# Patient Record
Sex: Female | Born: 1975 | Hispanic: Yes | Marital: Single | Smoking: Never smoker
Health system: Southern US, Community
[De-identification: ages and names within clinical notes are randomized; demographics above are authoritative.]

## PROBLEM LIST (undated history)

## (undated) ENCOUNTER — Emergency Department (HOSPITAL_COMMUNITY): Admission: EM | Payer: Self-pay | Source: Home / Self Care

## (undated) DIAGNOSIS — C55 Malignant neoplasm of uterus, part unspecified: Secondary | ICD-10-CM

## (undated) HISTORY — PX: ABDOMINAL HYSTERECTOMY: SHX81

## (undated) HISTORY — PX: APPENDECTOMY: SHX54

---

## 2017-01-27 ENCOUNTER — Emergency Department (HOSPITAL_COMMUNITY)
Admission: EM | Admit: 2017-01-27 | Discharge: 2017-01-27 | Disposition: A | Discharge status: Home / Self Care | Payer: Self-pay | Attending: Emergency Medicine | Admitting: Emergency Medicine

## 2017-01-27 ENCOUNTER — Encounter (HOSPITAL_COMMUNITY): Payer: Self-pay

## 2017-01-27 DIAGNOSIS — R21 Rash and other nonspecific skin eruption: Secondary | ICD-10-CM | POA: Insufficient documentation

## 2017-01-27 MED ORDER — TRIAMCINOLONE ACETONIDE 0.1 % EX CREA: 1.0000 "application " | TOPICAL_CREAM | Freq: Two times a day (BID) | CUTANEOUS | 0 refills | Status: DC

## 2017-01-27 NOTE — Discharge Instructions (Signed)
Apply the triamcinolone cream twice a day to the affected areas for itching and rash. Follow-up with the dermatologist as soon as possible. The dermatologist listed is merely a suggestion. You are free to choose any dermatologist.

## 2017-01-27 NOTE — ED Triage Notes (Signed)
Patient complains of 1 month of intermittent itchy rash to face and neck. Has taken benadryl with some relief. Alert and oriented, no distress. Denies any new products or meds

## 2017-01-27 NOTE — ED Notes (Signed)
Pt is in stable condition upon d/c and ambulates from ED. 

## 2017-01-27 NOTE — ED Provider Notes (Signed)
[facility] DEPT Provider Note   CSN: [id number] Arrival date & time: 01/27/17  1422   By signing my name below, I, Charolotte Eke, attest that this documentation has been prepared under the direction and in the presence of Edker Punt, PA-C. Electronically Signed: Charolotte Eke, Scribe. 01/27/17. 2:55 PM.    History   Chief Complaint Chief Complaint  Patient presents with  . Rash    HPI Lisa Boyd is a 41 y.o. female who presents to the Emergency Department complaining of intermittent erythematous, pruritic rash on face and chest that appeared 1 month ago. Pt denies hormone therapy, birth control. Pt states that the rash and a tingling sensation that "feels like ants on her skin" only lasts for a few hours and then disappears. It does not seem to be present with stress, different times of day, or sun exposure. She has taken benadryl with limited relief. She has been using OTC itch creams without relief. She has not sought treatment for this prior to arrival here today. She denies new products or medications. Also denies fever, feelings of illness, open wounds, or new complaints.  HPI  History reviewed. No pertinent past medical history.  There are no active problems to display for this patient.   History reviewed. No pertinent surgical history.  OB History    No data available       Home Medications    Prior to Admission medications   Medication Sig Start Date End Date Taking? Authorizing Provider  triamcinolone cream (KENALOG) 0.1 % Apply 1 application topically 2 (two) times daily. 01/27/17   Lorayne Bender, PA-C    Family History No family history on file.  Social History Social History  Substance Use Topics  . Smoking status: Never Smoker  . Smokeless tobacco: Never Used  . Alcohol use Not on file     Allergies   Penicillins   Review of Systems Review of Systems  Constitutional: Negative for fever.  HENT: Negative for facial swelling.     Respiratory: Negative for shortness of breath.   Skin: Positive for rash.     Physical Exam Updated Vital Signs There were no vitals taken for this visit.  Physical Exam  Constitutional: She appears well-developed and well-nourished. No distress.  HENT:  Head: Normocephalic and atraumatic.  No oral lesions.  Eyes: Conjunctivae are normal.  Neck: Neck supple.  Cardiovascular: Normal rate and regular rhythm.   Pulmonary/Chest: Effort normal.  Neurological: She is alert.  Skin: Skin is warm and dry. She is not diaphoretic.  Scattered macular erythematous rash to the face that extends to the superior chest. Rash spares her palms.  Psychiatric: She has a normal mood and affect. Her behavior is normal.  Nursing note and vitals reviewed.    ED Treatments / Results   DIAGNOSTIC STUDIES:  COORDINATION OF CARE: 2:55 PM Discussed treatment plan with pt at bedside and pt agreed to plan.    Labs (all labs ordered are listed, but only abnormal results are displayed) Labs Reviewed - No data to display  EKG  EKG Interpretation None       Radiology No results found.  Procedures Procedures (including critical care time)  Medications Ordered in ED Medications - No data to display   Initial Impression / Assessment and Plan / ED Course  I have reviewed the triage vital signs and the nursing notes.  Pertinent labs & imaging results that were available during my care of the patient were reviewed by me  and considered in my medical decision making (see chart for details).     Patient presents with an intermittent pruritic rash.  No red flag symptoms. PCP vs dermatology follow up. The patient was given instructions for home care as well as return precautions. Patient voices understanding of these instructions, accepts the plan, and is comfortable with discharge.    Final Clinical Impressions(s) / ED Diagnoses   Final diagnoses:  Rash    New Prescriptions Discharge  Medication List as of 01/27/2017  2:58 PM    START taking these medications   Details  triamcinolone cream (KENALOG) 0.1 % Apply 1 application topically 2 (two) times daily., Starting Thu 01/27/2017, Print       I personally performed the services described in this documentation, which was scribed in my presence. The recorded information has been reviewed and is accurate.   Lorayne Bender, PA-C 01/27/17 1732    Leo Grosser, MD 01/27/17 Lurline Hare

## 2017-10-21 ENCOUNTER — Other Ambulatory Visit: Payer: Self-pay

## 2017-10-21 ENCOUNTER — Encounter (HOSPITAL_COMMUNITY): Payer: Self-pay

## 2017-10-21 DIAGNOSIS — N390 Urinary tract infection, site not specified: Secondary | ICD-10-CM | POA: Insufficient documentation

## 2017-10-21 DIAGNOSIS — Z5321 Procedure and treatment not carried out due to patient leaving prior to being seen by health care provider: Secondary | ICD-10-CM | POA: Insufficient documentation

## 2017-10-21 LAB — POC URINE PREG, ED: PREG TEST UR: NEGATIVE

## 2017-10-21 LAB — URINALYSIS, ROUTINE W REFLEX MICROSCOPIC
Bilirubin Urine: NEGATIVE
Glucose, UA: NEGATIVE mg/dL
Ketones, ur: NEGATIVE mg/dL
Nitrite: NEGATIVE
Protein, ur: NEGATIVE mg/dL
SPECIFIC GRAVITY, URINE: 1.025 (ref 1.005–1.030)
pH: 5 (ref 5.0–8.0)

## 2017-10-21 NOTE — ED Triage Notes (Signed)
Pt states she has c/o burning when she urinate that started today; pt denies discharge; pt also c/o facial itching allover; pt has seen MD and was told to take benedrayl; pt has some facial peeling at triage; pt a&ox 4-Monique,RN

## 2017-10-22 ENCOUNTER — Emergency Department (HOSPITAL_COMMUNITY)
Admission: EM | Admit: 2017-10-22 | Discharge: 2017-10-22 | Discharge status: Against Medical Advice | Payer: Self-pay | Attending: Emergency Medicine | Admitting: Emergency Medicine

## 2017-10-22 HISTORY — DX: Malignant neoplasm of uterus, part unspecified: C55

## 2017-10-22 NOTE — ED Notes (Signed)
Called Pt for vitals no answer. 

## 2017-10-22 NOTE — ED Notes (Signed)
Pt called x2 by nurse to come back. No answer from pt. [facility] did not see pt in waiting room.

## 2017-10-22 NOTE — ED Notes (Signed)
Pt called for room no answer

## 2018-12-26 ENCOUNTER — Encounter (HOSPITAL_COMMUNITY): Payer: Self-pay | Admitting: *Deleted

## 2018-12-26 ENCOUNTER — Emergency Department (HOSPITAL_COMMUNITY)
Admission: EM | Admit: 2018-12-26 | Discharge: 2018-12-26 | Disposition: A | Discharge status: Home / Self Care | Payer: Self-pay | Attending: Emergency Medicine | Admitting: Emergency Medicine

## 2018-12-26 ENCOUNTER — Other Ambulatory Visit: Payer: Self-pay

## 2018-12-26 DIAGNOSIS — M79602 Pain in left arm: Secondary | ICD-10-CM | POA: Insufficient documentation

## 2018-12-26 DIAGNOSIS — M791 Myalgia, unspecified site: Secondary | ICD-10-CM

## 2018-12-26 DIAGNOSIS — M62838 Other muscle spasm: Secondary | ICD-10-CM | POA: Insufficient documentation

## 2018-12-26 LAB — CBC WITH DIFFERENTIAL/PLATELET
Abs Immature Granulocytes: 0.01 10*3/uL (ref 0.00–0.07)
Basophils Absolute: 0 10*3/uL (ref 0.0–0.1)
Basophils Relative: 0 %
EOS ABS: 0.2 10*3/uL (ref 0.0–0.5)
EOS PCT: 3 %
HCT: 43.8 % (ref 36.0–46.0)
Hemoglobin: 14.6 g/dL (ref 12.0–15.0)
IMMATURE GRANULOCYTES: 0 %
Lymphocytes Relative: 37 %
Lymphs Abs: 2.3 10*3/uL (ref 0.7–4.0)
MCH: 29.4 pg (ref 26.0–34.0)
MCHC: 33.3 g/dL (ref 30.0–36.0)
MCV: 88.1 fL (ref 80.0–100.0)
MONO ABS: 0.3 10*3/uL (ref 0.1–1.0)
Monocytes Relative: 5 %
Neutro Abs: 3.4 10*3/uL (ref 1.7–7.7)
Neutrophils Relative %: 55 %
PLATELETS: 227 10*3/uL (ref 150–400)
RBC: 4.97 MIL/uL (ref 3.87–5.11)
RDW: 13.1 % (ref 11.5–15.5)
WBC: 6.2 10*3/uL (ref 4.0–10.5)
nRBC: 0 % (ref 0.0–0.2)

## 2018-12-26 LAB — BASIC METABOLIC PANEL
ANION GAP: 3 — AB (ref 5–15)
BUN: 16 mg/dL (ref 6–20)
CHLORIDE: 110 mmol/L (ref 98–111)
CO2: 24 mmol/L (ref 22–32)
CREATININE: 0.56 mg/dL (ref 0.44–1.00)
Calcium: 9.4 mg/dL (ref 8.9–10.3)
GFR calc non Af Amer: >60 mL/min (ref >60)
Glucose, Bld: 138 mg/dL — ABNORMAL HIGH (ref 70–99)
Potassium: 3.7 mmol/L (ref 3.5–5.1)
SODIUM: 137 mmol/L (ref 135–145)

## 2018-12-26 LAB — CK: Total CK: 88 U/L (ref 38–234)

## 2018-12-26 MED ORDER — KETOROLAC TROMETHAMINE 60 MG/2ML IM SOLN
60.0000 mg | Freq: Once | INTRAMUSCULAR | Status: AC
  Administered 2018-12-26: 60 mg via INTRAMUSCULAR
  Filled 2018-12-26: qty 2

## 2018-12-26 MED ORDER — CYCLOBENZAPRINE HCL 10 MG PO TABS: 10.0000 mg | ORAL_TABLET | Freq: Two times a day (BID) | ORAL | 0 refills | Status: DC | PRN

## 2018-12-26 MED ORDER — NAPROXEN 500 MG PO TABS: 500.0000 mg | ORAL_TABLET | Freq: Two times a day (BID) | ORAL | 0 refills | Status: DC

## 2018-12-26 NOTE — ED Triage Notes (Signed)
Pt reports Lt arm has been hurting for 3 weeks. Pt denies any injury.

## 2018-12-26 NOTE — ED Provider Notes (Signed)
[facility] EMERGENCY DEPARTMENT Provider Note   CSN: [id number] Arrival date & time: 12/26/18  1517    History   Chief Complaint Chief Complaint  Patient presents with  . Arm Pain    HPI Lisa Boyd is a 43 y.o. female.     Patient is a 43 year old female with a past history of uterine cancer presenting today with diffuse body pain more significantly in the left upper extremity that radiates down the arm.  She feels the tingling in her arm but denies any weakness.  Patient states last week she had general malaise, headache, fever a few days, diarrhea 1 to 2 days which has resolved.  She has intermittently felt nauseated but denies any vomiting.  She denies any travel or sick contacts in her home.  She has been taking Tylenol and ibuprofen without improvement of her discomfort.  She denies any urinary symptoms.  She does not have menses after having a hysterectomy.  The history is provided by the patient.  Arm Pain  This is a new problem.    Past Medical History:  Diagnosis Date  . Uterine cancer ([place])    2016    There are no active problems to display for this patient.   Past Surgical History:  Procedure Laterality Date  . APPENDECTOMY       OB History   No obstetric history on file.      Home Medications    Prior to Admission medications   Medication Sig Start Date End Date Taking? Authorizing Provider  triamcinolone cream (KENALOG) 0.1 % Apply 1 application topically 2 (two) times daily. 01/27/17   Lorayne Bender, PA-C    Family History History reviewed. No pertinent family history.  Social History Social History   Tobacco Use  . Smoking status: Never Smoker  . Smokeless tobacco: Never Used  Substance Use Topics  . Alcohol use: No    Frequency: Never  . Drug use: No     Allergies   Penicillins   Review of Systems Review of Systems  Constitutional: Positive for chills.  HENT: Positive for congestion and  rhinorrhea.   Respiratory: Positive for cough.   All other systems reviewed and are negative.    Physical Exam Updated Vital Signs BP 126/77 (BP Location: Right Arm)   Pulse 85   Temp 98.7 F (37.1 C) (Oral)   Resp 16   Ht 5\' 6"  (1.676 m)   Wt 69.9 kg   SpO2 97%   BMI 24.86 kg/m   Physical Exam Vitals signs and nursing note reviewed.  Constitutional:      General: She is not in acute distress.    Appearance: She is well-developed.  HENT:     Head: Normocephalic and atraumatic.  Eyes:     Pupils: Pupils are equal, round, and reactive to light.  Cardiovascular:     Rate and Rhythm: Normal rate and regular rhythm.     Heart sounds: Normal heart sounds. No murmur. No friction rub.  Pulmonary:     Effort: Pulmonary effort is normal.     Breath sounds: Normal breath sounds. No wheezing or rales.  Abdominal:     General: Bowel sounds are normal. There is no distension.     Palpations: Abdomen is soft.     Tenderness: There is no abdominal tenderness. There is no guarding or rebound.  Musculoskeletal: Normal range of motion.        General: Tenderness present.  Left shoulder: She exhibits pain and spasm. She exhibits normal range of motion, no bony tenderness, normal pulse and normal strength.     Cervical back: She exhibits tenderness. She exhibits normal range of motion and no bony tenderness.     Lumbar back: She exhibits tenderness. She exhibits no bony tenderness.       Back:     Comments: No edema.  2+ radial pulse in the LUE  Skin:    General: Skin is warm and dry.     Findings: No rash.  Neurological:     General: No focal deficit present.     Mental Status: She is alert and oriented to person, place, and time. Mental status is at baseline.     Cranial Nerves: No cranial nerve deficit.  Psychiatric:        Mood and Affect: Mood normal.        Behavior: Behavior normal.        Thought Content: Thought content normal.      ED Treatments / Results  Labs  (all labs ordered are listed, but only abnormal results are displayed) Labs Reviewed  BASIC METABOLIC PANEL - Abnormal; Notable for the following components:      Result Value   Glucose, Bld 138 (*)    Anion gap 3 (*)    All other components within normal limits  CK  CBC WITH DIFFERENTIAL/PLATELET    EKG None  Radiology No results found.  Procedures Procedures (including critical care time)  Medications Ordered in ED Medications  ketorolac (TORADOL) injection 60 mg (60 mg Intramuscular Given 12/26/18 1604)     Initial Impression / Assessment and Plan / ED Course  I have reviewed the triage vital signs and the nursing notes.  Pertinent labs & imaging results that were available during my care of the patient were reviewed by me and considered in my medical decision making (see chart for details).       Patient presenting with flulike illness over the past few weeks that has mostly improved but she continues to have pain in her left scapular area causing pain to radiate down her arm with some tingling.  It is worse with palpation.  She denies any shortness of breath or chest pain.  She has not had any fever recently.  She has no sore throat or shortness of breath.  She has had some intermittent nausea and occasional diarrhea but nothing in the last 24 hours.  She has taken Tylenol and ibuprofen for this discomfort without improvement.  On exam patient has significant muscle spasm and tenderness with palpation in the scapular area.  She has normal pulses bilaterally and no evidence of skin findings.  Low suspicion for vascular cause of her symptoms.  She denies any cough now or shortness of breath and low suspicion for pneumonia or PE.  Patient's vital signs are reassuring.  Given diffuse body pain will check a CK, CBC and BMP to ensure no other acute findings.  Patient given IM Toradol.  5:07 PM Labs are reassuring with normal CK and renal function.  CBC with normal white count and  lymphocytes. Final Clinical Impressions(s) / ED Diagnoses   Final diagnoses:  Trapezius muscle spasm  Myalgia    ED Discharge Orders         Ordered    cyclobenzaprine (FLEXERIL) 10 MG tablet  2 times daily PRN     12/26/18 1705    naproxen (NAPROSYN) 500 MG tablet  2  times daily     12/26/18 1705           Blanchie Dessert, MD 12/26/18 1708

## 2018-12-26 NOTE — Discharge Instructions (Signed)
Muscle relaxer at night as it may make you drowsy.  Getting a therapeutic massage may also help with pain

## 2019-01-06 ENCOUNTER — Emergency Department (HOSPITAL_COMMUNITY)
Admission: EM | Admit: 2019-01-06 | Discharge: 2019-01-06 | Disposition: A | Discharge status: Home / Self Care | Payer: Self-pay | Attending: Emergency Medicine | Admitting: Emergency Medicine

## 2019-01-06 ENCOUNTER — Emergency Department (HOSPITAL_COMMUNITY): Payer: Self-pay

## 2019-01-06 ENCOUNTER — Encounter (HOSPITAL_COMMUNITY): Payer: Self-pay

## 2019-01-06 ENCOUNTER — Other Ambulatory Visit: Payer: Self-pay

## 2019-01-06 DIAGNOSIS — R05 Cough: Secondary | ICD-10-CM | POA: Insufficient documentation

## 2019-01-06 DIAGNOSIS — Z79899 Other long term (current) drug therapy: Secondary | ICD-10-CM | POA: Insufficient documentation

## 2019-01-06 DIAGNOSIS — R51 Headache: Secondary | ICD-10-CM | POA: Insufficient documentation

## 2019-01-06 DIAGNOSIS — J029 Acute pharyngitis, unspecified: Secondary | ICD-10-CM | POA: Insufficient documentation

## 2019-01-06 DIAGNOSIS — M791 Myalgia, unspecified site: Secondary | ICD-10-CM | POA: Insufficient documentation

## 2019-01-06 DIAGNOSIS — R059 Cough, unspecified: Secondary | ICD-10-CM

## 2019-01-06 DIAGNOSIS — Z8542 Personal history of malignant neoplasm of other parts of uterus: Secondary | ICD-10-CM | POA: Insufficient documentation

## 2019-01-06 DIAGNOSIS — R519 Headache, unspecified: Secondary | ICD-10-CM

## 2019-01-06 DIAGNOSIS — M25522 Pain in left elbow: Secondary | ICD-10-CM | POA: Insufficient documentation

## 2019-01-06 MED ORDER — ONDANSETRON HCL 4 MG PO TABS
4.0000 mg | ORAL_TABLET | Freq: Once | ORAL | Status: AC
  Administered 2019-01-06: 4 mg via ORAL
  Filled 2019-01-06: qty 1

## 2019-01-06 MED ORDER — KETOROLAC TROMETHAMINE 60 MG/2ML IM SOLN
30.0000 mg | Freq: Once | INTRAMUSCULAR | Status: AC
  Administered 2019-01-06: 30 mg via INTRAMUSCULAR
  Filled 2019-01-06: qty 2

## 2019-01-06 MED ORDER — ACETAMINOPHEN 325 MG PO TABS
650.0000 mg | ORAL_TABLET | Freq: Once | ORAL | Status: AC
  Administered 2019-01-06: 20:00:00 650 mg via ORAL
  Filled 2019-01-06: qty 2

## 2019-01-06 MED ORDER — DICLOFENAC SODIUM 1 % TD GEL: 4.0000 g | Freq: Four times a day (QID) | TRANSDERMAL | 0 refills | Status: DC | PRN

## 2019-01-06 NOTE — ED Triage Notes (Addendum)
Onset 2 days ago headache, body aches, nausea and sore throat.  No one in household sick.  No V/D.

## 2019-01-06 NOTE — ED Provider Notes (Signed)
[facility] EMERGENCY DEPARTMENT Provider Note   CSN: [id number] Arrival date & time: 01/06/19  1918    History   Chief Complaint No chief complaint on file. head and body aches, sore throat  HPI Lisa Boyd is a 43 y.o. female presenting to emergency department today with chief complaint of headache, generalized body aches, sore throat x2 days. Pt has not taken any medications for her symptoms prior to arrival.  Patient states she has pain in the back of her head that she describes as a throbbing.  The pain does not radiate.  She rates the pain 6 of 10 in severity. Denies sudden onset and states the headache has progressively worsened.  Denies visual changes, rash, fever, neck pain, back pain, dizziness. Admits to intermittent nausea.  Patient describes her throat as feeling sore.  She states the pain is worse when she swallows and eats.  She has decreased appetite because of the pain, she is able to tolerate PO intake.  Also reports intermittent cough.  She states the cough started x 1 week ago. Cough is nonproductive and worse at night.  Denies shortness of breath, chest pain, vomiting, diarrhea.  Denies any sick contacts or known exposure to covid-19 positive person.  History obtained using telephone interpretor.       Past Medical History:  Diagnosis Date  . Uterine cancer ([place])    2016    There are no active problems to display for this patient.   Past Surgical History:  Procedure Laterality Date  . ABDOMINAL HYSTERECTOMY    . APPENDECTOMY       OB History   No obstetric history on file.      Home Medications    Prior to Admission medications   Medication Sig Start Date End Date Taking? Authorizing Provider  cyclobenzaprine (FLEXERIL) 10 MG tablet Take 1 tablet (10 mg total) by mouth 2 (two) times daily as needed for muscle spasms. 12/26/18   Blanchie Dessert, MD  diclofenac sodium (VOLTAREN) 1 % GEL Apply 4 g topically 4 (four)  times daily as needed. Apply to left elbow 01/06/19   Albrizze, Verline Lema E, PA-C  naproxen (NAPROSYN) 500 MG tablet Take 1 tablet (500 mg total) by mouth 2 (two) times daily. 12/26/18   Blanchie Dessert, MD  triamcinolone cream (KENALOG) 0.1 % Apply 1 application topically 2 (two) times daily. 01/27/17   Lorayne Bender, PA-C    Family History History reviewed. No pertinent family history.  Social History Social History   Tobacco Use  . Smoking status: Never Smoker  . Smokeless tobacco: Never Used  Substance Use Topics  . Alcohol use: No    Frequency: Never  . Drug use: No     Allergies   Penicillins   Review of Systems Review of Systems  Constitutional: Negative for chills and fever.  HENT: Positive for congestion. Negative for ear discharge, ear pain, sinus pressure, sinus pain and sore throat.   Eyes: Negative for pain and redness.  Respiratory: Positive for cough. Negative for shortness of breath.   Cardiovascular: Negative for chest pain.  Gastrointestinal: Positive for nausea. Negative for abdominal pain, constipation, diarrhea and vomiting.  Genitourinary: Negative for dysuria and hematuria.  Musculoskeletal: Positive for arthralgias. Negative for back pain and neck pain.  Skin: Negative for wound.  Neurological: Positive for headaches. Negative for weakness and numbness.     Physical Exam Updated Vital Signs BP (!) 146/73 (BP Location: Right Arm)   Pulse 84  Temp 98.7 F (37.1 C) (Oral)   Resp (!) 23   SpO2 98%   Physical Exam Vitals signs and nursing note reviewed.  Constitutional:      Appearance: She is well-developed. She is not toxic-appearing.  HENT:     Head: Normocephalic and atraumatic.     Comments: No temporal tenderness. Maxillary sinus tenderness     Mouth/Throat:     Mouth: Mucous membranes are moist.     Pharynx: Oropharynx is clear.     Comments: Minor erythema to oropharynx, no edema, no exudate, no tonsillar swelling, voice normal, neck  supple without lymphadenopathy  Eyes:     General: No scleral icterus.       Right eye: No discharge.        Left eye: No discharge.     Conjunctiva/sclera: Conjunctivae normal.  Neck:     Musculoskeletal: Normal range of motion.  Cardiovascular:     Rate and Rhythm: Normal rate and regular rhythm.     Pulses: Normal pulses.     Heart sounds: Normal heart sounds.  Pulmonary:     Effort: Pulmonary effort is normal.     Breath sounds: Normal breath sounds.  Abdominal:     General: There is no distension.  Musculoskeletal: Normal range of motion.     Left shoulder: Normal.     Left elbow: She exhibits no swelling, no effusion, no deformity and no laceration. Tenderness found. Olecranon process tenderness noted.     Left wrist: Normal.  Skin:    General: Skin is warm and dry.  Neurological:     Mental Status: She is oriented to person, place, and time.     Comments: Speech is clear and goal oriented, follows commands CN III-XII intact, no facial droop Normal strength in upper and lower extremities bilaterally including dorsiflexion and plantar flexion, strong and equal grip strength Sensation normal to light and sharp touch Moves extremities without ataxia, coordination intact Normal finger to nose and rapid alternating movements Normal gait and balance   Psychiatric:        Behavior: Behavior normal.      ED Treatments / Results  Labs (all labs ordered are listed, but only abnormal results are displayed) Labs Reviewed - No data to display  EKG None  Radiology Dg Elbow Complete Left  Result Date: 01/06/2019 CLINICAL DATA:  43 year old female with left elbow pain. EXAM: LEFT ELBOW - COMPLETE 3+ VIEW COMPARISON:  None. FINDINGS: There is no evidence of fracture, dislocation, or joint effusion. There is no evidence of arthropathy or other focal bone abnormality. Soft tissues are unremarkable. IMPRESSION: Negative. Electronically Signed   By: Anner Crete M.D.   On:  01/06/2019 20:18   Ct Chest Wo Contrast  Result Date: 01/06/2019 CLINICAL DATA:  43 year old female with cough. EXAM: CT CHEST WITHOUT CONTRAST TECHNIQUE: Multidetector CT imaging of the chest was performed following the standard protocol without IV contrast. COMPARISON:  Chest radiograph dated 01/06/2019 FINDINGS: Evaluation of this exam is limited in the absence of intravenous contrast. Cardiovascular: There is no cardiomegaly or pericardial effusion. The thoracic aorta and the central pulmonary arteries are grossly unremarkable on this noncontrast CT. Mediastinum/Nodes: There is no hilar or mediastinal adenopathy. The esophagus and the thyroid gland are grossly unremarkable as visualized. No mediastinal fluid collection. Residual thymic tissue noted in the anterior mediastinum. Lungs/Pleura: Air age the lungs are clear. There is no pleural effusion or pneumothorax. The central airways are patent. Upper Abdomen: No acute abnormality.  Musculoskeletal: No chest wall mass or suspicious bone lesions identified. IMPRESSION: No acute intrathoracic pathology. Electronically Signed   By: Anner Crete M.D.   On: 01/06/2019 22:17   Dg Chest Portable 1 View  Result Date: 01/06/2019 CLINICAL DATA:  43 year old female with cough. EXAM: PORTABLE CHEST 1 VIEW COMPARISON:  None. FINDINGS: Focal area of hazy density in the left perihilar region may represent hilar vasculature although developing infiltrate or mass is not entirely excluded. CT may provide better evaluation. The lungs are otherwise clear. There is no pleural effusion or pneumothorax. The cardiac silhouette is within normal limits. No acute osseous pathology. IMPRESSION: Left hilar vasculature versus less likely developing infiltrate or lesion. CT may provide better evaluation. Electronically Signed   By: Anner Crete M.D.   On: 01/06/2019 20:22    Procedures Procedures (including critical care time)  Medications Ordered in ED Medications   acetaminophen (TYLENOL) tablet 650 mg (650 mg Oral Given 01/06/19 1952)  ondansetron (ZOFRAN) tablet 4 mg (4 mg Oral Given 01/06/19 1952)  ketorolac (TORADOL) injection 30 mg (30 mg Intramuscular Given 01/06/19 2120)     Initial Impression / Assessment and Plan / ED Course  I have reviewed the triage vital signs and the nursing notes.  Pertinent labs & imaging results that were available during my care of the patient were reviewed by me and considered in my medical decision making (see chart for details).     Pt is afebrile, non toxic appearing. Pt later informed nurse of left elbow pain x 1 week. She has increasing aching pain with unknown injury. Elbow is tender to palpation, xray viewed by me does not show acute fracture, dislocation, or any abnormalities.  Pt HA treated with PO tylenol and IM toradol and improved while in ED.  Presentation is like pts typical HA and non concerning for [facility], ICH, Meningitis, or temporal arteritis. Neuro exam without focal neuro deficits, nuchal rigidity, or change in vision.   Given duration of cough chest xray ordered and viewed by me shows left hilar vasculature that is questionable for developing infiltrate or lesion. CT scan chest recommend to further evaluate by radiologist. I viewed CT chest and it does not show infiltrate or lesion, making pneumonia less likely. I agree with radiologist reading.  Patient is hemodynamically stable, in NAD, and able to ambulate in the ED. Evaluation does not show pathology that would require ongoing emergent intervention or inpatient treatment. I explained the diagnosis to the patient. Patient is comfortable with above plan and is stable for discharge at this time. All questions were answered prior to disposition. Strict return precautions for returning to the ED were discussed. Encouraged follow up with PCP.    Tyia Deshannon Seide was evaluated in Emergency Department on 01/06/2019 for the symptoms described in the history of  present illness. She was evaluated in the context of the global COVID-19 pandemic, which necessitated consideration that the patient might be at risk for infection with the SARS-CoV-2 virus that causes COVID-19. Institutional protocols and algorithms that pertain to the evaluation of patients at risk for COVID-19 are in a state of rapid change based on information released by regulatory bodies including the CDC and federal and state organizations. These policies and algorithms were followed during the patient's care in the ED.     Final Clinical Impressions(s) / ED Diagnoses   Final diagnoses:  Acute nonintractable headache, unspecified headache type  Cough  Left elbow pain    ED Discharge Orders  Ordered    diclofenac sodium (VOLTAREN) 1 % GEL  4 times daily PRN     01/06/19 2234           Flint Melter 01/06/19 2305    Hayden Rasmussen, MD 01/07/19 1057

## 2019-01-06 NOTE — Discharge Instructions (Addendum)
Today you were evaluated at the emergency department for a headache, cough, left elbow pain.  1. Medications: continue usual home medications  Prescription has been sent to your pharmacy for Voltaren gel.  You can apply this to her elbow as needed up to 4 times a day.  Should help with pain, please use as directed.  2. Treatment: rest, drink plenty of fluids, if headache persists take 610mmg ibuprofen or tylenol.  Be sure to exercise your elbow frequently so it does not get stiff.  3. Follow Up: Please followup with your primary doctor in 3 days for discussion of your diagnoses and further evaluation after today's visit; if you do not have a primary care doctor use the resource guide provided to find one; Please return to the ER for double vision, speech difficulty, gait disturbance, persistent vomiting or other concerns.  Headache:  You are having a headache. No specific cause was found today for your headache. It may have been a migraine or other cause of headache. Stress, anxiety, fatigue, and depression are common triggers for headaches. Your headache today does not appear to be life-threatening or require hospitalization, but often the exact cause of headaches is not determined in the emergency department. Therefore, followup with your doctor is very important to find out what may have caused your headache, and whether or not you need any further diagnostic testing or treatment. Sometimes headaches can appear benign but then more serious symptoms can develop which should prompt an immediate reevaluation by your doctor or the emergency department.  Hydration: Have a goal of about a half liter of water every couple hours to stay well hydrated.   Sleep: Please be sure to get plenty of sleep with a goal of 8 hours per night. Having a regular bed time and bedtime routine can help with this.  Screens: Reduce the amount of time you are in front of screens.  Take about a 5-10-minute break every hour  or every couple hours to give your eyes rest.  Do not use screens in dark rooms.  Glasses with a blue light filter may also help reduce eye fatigue.  Stress: Take steps to reduce stress as much as possible.   Seek immediate medical attention if:  You develop possible problems with medications prescribed. The medications don't resolve your headache, if it recurs, or if you have multiple episodes of vomiting or can't take fluids by mouth You have a change from the usual headache. If you developed a sudden severe headache or confusion, become poorly responsive or faint, developed a fever above 100.4 or problems breathing, have a change in speech, vision, swallowing or understanding, or developed new weakness, numbness, tingling, incoordination or have a seizure.

## 2019-01-06 NOTE — ED Triage Notes (Signed)
Pt also c/o pain in left arm specifically the elbow area.  Has been taking muscle relaxants with no relief.

## 2019-01-06 NOTE — ED Notes (Signed)
E-signature not available, verbalized understanding of DC instructions and prescriptions.  

## 2019-03-24 ENCOUNTER — Other Ambulatory Visit: Payer: Self-pay

## 2019-03-24 ENCOUNTER — Emergency Department (HOSPITAL_COMMUNITY)
Admission: EM | Admit: 2019-03-24 | Discharge: 2019-03-24 | Disposition: A | Discharge status: Home / Self Care | Payer: HRSA Program | Attending: Emergency Medicine | Admitting: Emergency Medicine

## 2019-03-24 ENCOUNTER — Encounter (HOSPITAL_COMMUNITY): Payer: Self-pay | Admitting: Emergency Medicine

## 2019-03-24 DIAGNOSIS — M7918 Myalgia, other site: Secondary | ICD-10-CM | POA: Diagnosis present

## 2019-03-24 DIAGNOSIS — Z79899 Other long term (current) drug therapy: Secondary | ICD-10-CM | POA: Diagnosis not present

## 2019-03-24 DIAGNOSIS — Z20822 Contact with and (suspected) exposure to covid-19: Secondary | ICD-10-CM

## 2019-03-24 DIAGNOSIS — U071 COVID-19: Secondary | ICD-10-CM | POA: Diagnosis not present

## 2019-03-24 MED ORDER — ACETAMINOPHEN 500 MG PO TABS: 500.0000 mg | ORAL_TABLET | Freq: Four times a day (QID) | ORAL | 0 refills | Status: DC | PRN

## 2019-03-24 MED ORDER — BENZONATATE 100 MG PO CAPS: 100.0000 mg | ORAL_CAPSULE | Freq: Three times a day (TID) | ORAL | 0 refills | Status: DC

## 2019-03-24 NOTE — ED Triage Notes (Signed)
Patient arrives POV c/o generalized body aches x 3 days. While using Spanish interpreter, patient reports nausea, fatigue, fever, headache, cough and dizziness. Non productive cough with sore throat. States no problems with oral intake.

## 2019-03-24 NOTE — ED Provider Notes (Signed)
[facility] EMERGENCY DEPARTMENT Provider Note   CSN: [id number] Arrival date & time: 03/24/19  1713     History   Chief Complaint Chief Complaint  Patient presents with  . Generalized Body Aches    HPI Lisa Boyd is a 43 y.o. female.     The history is provided by the patient. The history is limited by a language barrier. A language interpreter was used.     43 year old Hispanic female presents to the ED for concerns of potential COVID-19 infection.  Language interpreter was used.  Patient states she was exposed to a coworker that test positive for COVID-19 recently.  For the past 3 days she endorsed generalized body aches, nausea, feeling fatigued, having subjective fever, headache, nonproductive cough and dizziness.  She also notes a sore throat.  She denies vomiting or diarrhea.  She has been taking ibuprofen with some relief.  No recent travel.  Denies tobacco use no prior history of PE or DVT, no significant health problem.  Past Medical History:  Diagnosis Date  . Uterine cancer ([place])    2016    There are no active problems to display for this patient.   Past Surgical History:  Procedure Laterality Date  . ABDOMINAL HYSTERECTOMY    . APPENDECTOMY       OB History   No obstetric history on file.      Home Medications    Prior to Admission medications   Medication Sig Start Date End Date Taking? Authorizing Provider  cyclobenzaprine (FLEXERIL) 10 MG tablet Take 1 tablet (10 mg total) by mouth 2 (two) times daily as needed for muscle spasms. 12/26/18   Blanchie Dessert, MD  diclofenac sodium (VOLTAREN) 1 % GEL Apply 4 g topically 4 (four) times daily as needed. Apply to left elbow 01/06/19   Albrizze, Verline Lema E, PA-C  naproxen (NAPROSYN) 500 MG tablet Take 1 tablet (500 mg total) by mouth 2 (two) times daily. 12/26/18   Blanchie Dessert, MD  triamcinolone cream (KENALOG) 0.1 % Apply 1 application topically 2 (two) times daily.  01/27/17   Lorayne Bender, PA-C    Family History No family history on file.  Social History Social History   Tobacco Use  . Smoking status: Never Smoker  . Smokeless tobacco: Never Used  Substance Use Topics  . Alcohol use: No    Frequency: Never  . Drug use: No     Allergies   Penicillins   Review of Systems Review of Systems  All other systems reviewed and are negative.    Physical Exam Updated Vital Signs BP 133/84 (BP Location: Right Arm)   Pulse 94   Temp 99.6 F (37.6 C) (Oral)   Resp 17   Ht 5\' 6"  (1.676 m)   Wt 71.2 kg   SpO2 98%   BMI 25.34 kg/m   Physical Exam Vitals signs and nursing note reviewed.  Constitutional:      General: She is not in acute distress.    Appearance: She is well-developed.  HENT:     Head: Atraumatic.  Eyes:     Conjunctiva/sclera: Conjunctivae normal.  Neck:     Musculoskeletal: Neck supple.  Cardiovascular:     Rate and Rhythm: Normal rate and regular rhythm.     Pulses: Normal pulses.     Heart sounds: Normal heart sounds.  Pulmonary:     Effort: Pulmonary effort is normal.     Breath sounds: Normal breath sounds. No wheezing, rhonchi  or rales.  Abdominal:     General: Abdomen is flat.     Palpations: Abdomen is soft.     Tenderness: There is no abdominal tenderness.  Skin:    Findings: No rash.  Neurological:     Mental Status: She is alert and oriented to person, place, and time.  Psychiatric:        Mood and Affect: Mood normal.      ED Treatments / Results  Labs (all labs ordered are listed, but only abnormal results are displayed) Labs Reviewed  NOVEL CORONAVIRUS, NAA (HOSPITAL ORDER, SEND-OUT TO REF LAB)    EKG    Radiology No results found.  Procedures Procedures (including critical care time)  Medications Ordered in ED Medications - No data to display   Initial Impression / Assessment and Plan / ED Course  I have reviewed the triage vital signs and the nursing notes.  Pertinent  labs & imaging results that were available during my care of the patient were reviewed by me and considered in my medical decision making (see chart for details).        BP 133/84 (BP Location: Right Arm)   Pulse 94   Temp 99.6 F (37.6 C) (Oral)   Resp 17   Ht 5\' 6"  (1.676 m)   Wt 71.2 kg   SpO2 98%   BMI 25.34 kg/m    Final Clinical Impressions(s) / ED Diagnoses   Final diagnoses:  Suspected Covid-19 Virus Infection    ED Discharge Orders         Ordered    benzonatate (TESSALON) 100 MG capsule  Every 8 hours     03/24/19 1803    acetaminophen (TYLENOL) 500 MG tablet  Every 6 hours PRN     03/24/19 1803         5:58 PM Patient here with viral symptoms suggestive of COVID-19.  Recent exposure to another person to test positive for COVID-19.  She is not hypoxic.  She is well-appearing.  Vital signs stable.  Will obtain COVID-19 test.  Encourage patient to self quarantine, will provide medication for symptom control, return precaution discussed.  All questions answered to patient satisfaction using language interpreter.  Lisa Boyd was evaluated in Emergency Department on 03/24/2019 for the symptoms described in the history of present illness. She was evaluated in the context of the global COVID-19 pandemic, which necessitated consideration that the patient might be at risk for infection with the SARS-CoV-2 virus that causes COVID-19. Institutional protocols and algorithms that pertain to the evaluation of patients at risk for COVID-19 are in a state of rapid change based on information released by regulatory bodies including the CDC and federal and state organizations. These policies and algorithms were followed during the patient's care in the ED.    Domenic Moras, PA-C 03/24/19 Dukes, [place], DO 03/24/19 2355

## 2019-03-28 LAB — NOVEL CORONAVIRUS, NAA (HOSP ORDER, SEND-OUT TO REF LAB; TAT 18-24 HRS): SARS-CoV-2, NAA: DETECTED — AB

## 2019-10-12 ENCOUNTER — Emergency Department (HOSPITAL_COMMUNITY): Payer: Self-pay

## 2019-10-12 ENCOUNTER — Encounter (HOSPITAL_COMMUNITY): Payer: Self-pay

## 2019-10-12 ENCOUNTER — Emergency Department (HOSPITAL_COMMUNITY)
Admission: EM | Admit: 2019-10-12 | Discharge: 2019-10-12 | Disposition: A | Discharge status: Home / Self Care | Payer: Self-pay | Attending: Emergency Medicine | Admitting: Emergency Medicine

## 2019-10-12 DIAGNOSIS — Z8542 Personal history of malignant neoplasm of other parts of uterus: Secondary | ICD-10-CM | POA: Insufficient documentation

## 2019-10-12 DIAGNOSIS — R202 Paresthesia of skin: Secondary | ICD-10-CM | POA: Insufficient documentation

## 2019-10-12 DIAGNOSIS — R2231 Localized swelling, mass and lump, right upper limb: Secondary | ICD-10-CM | POA: Insufficient documentation

## 2019-10-12 DIAGNOSIS — M79644 Pain in right finger(s): Secondary | ICD-10-CM | POA: Insufficient documentation

## 2019-10-12 NOTE — Progress Notes (Signed)
Orthopedic Tech Progress Note Patient Details:  Loreen Tyma 01/22/1976 [id number]  Ortho Devices Type of Ortho Device: Thumb velcro splint Ortho Device/Splint Location: rue Ortho Device/Splint Interventions: Ordered, Application, Adjustment   Post Interventions Patient Tolerated: Well Instructions Provided: Care of device, Adjustment of device   Karolee Stamps 10/12/2019, 7:59 PM

## 2019-10-12 NOTE — ED Triage Notes (Signed)
Pt reports 2 months of right thumb pain, denies injury, limited ROM. Slight swelling noted

## 2019-10-12 NOTE — Discharge Instructions (Signed)
You were seen in the ER for right thumb pain  X-ray was normal, did not reveal a cause to your symptoms  I suspect your pain is due to a soft tissue overuse injury - either inflammation of your muscles in thumb or tendons or ligaments or nerves.    Wear thumb splint for at least 5-7 days   Ice  Avoid repeated movements or modify work/day to day activities  Alternate ibuprofen or acetaminophen every 6 hours for the next 5 days to help with pain  Return to work/activities slowly, this may cause pain again  Call  primary care doctor or hand orthopedist for re-evaluation if symptoms continue and do not improve  Return for swelling redness warmth fever or other signs of infection in joint, loss of sensation or purple discoloration to extremity

## 2019-10-12 NOTE — ED Provider Notes (Signed)
[facility] EMERGENCY DEPARTMENT Provider Note   CSN: [id number] Arrival date & time: 10/12/19  1642     History Chief Complaint  Patient presents with  . Hand Pain    Lisa Boyd is a 44 y.o. female presents to the ER for evaluation of right thumb pain.  Onset 3 to 4 months ago.  Has associated swelling at the base of her thumb.  Occasionally get itching, tingling sensation along the tip of her thumb as well.  Feels like the joint at the base of her thumb is stiff and sometimes gets stuck in flexed position. she can stretch it out slowly and finger will snap back straight and able to move it.  States over the last few months she has been able to manage the pain with over-the-counter pain medicines that usually take the pain away but pain eventually returns.  She is right-hand dominant.  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  Denies recent trauma. No previous injury or surgeries or recent injections. No redness, warmth, fevers. No associated elbow, shoulder or neck pain.  HPI     Past Medical History:  Diagnosis Date  . Uterine cancer ([place])    2016    There are no problems to display for this patient.   Past Surgical History:  Procedure Laterality Date  . ABDOMINAL HYSTERECTOMY    . APPENDECTOMY       OB History   No obstetric history on file.     No family history on file.  Social History   Tobacco Use  . Smoking status: Never Smoker  . Smokeless tobacco: Never Used  Substance Use Topics  . Alcohol use: No  . Drug use: No    Home Medications Prior to Admission medications   Medication Sig Start Date End Date Taking? Authorizing Provider  acetaminophen (TYLENOL) 500 MG tablet Take 1 tablet (500 mg total) by mouth every 6 (six) hours as needed. 03/24/19   Domenic Moras, PA-C  benzonatate (TESSALON) 100 MG capsule Take 1 capsule (100 mg total) by mouth every 8 (eight) hours.  03/24/19   Domenic Moras, PA-C  cyclobenzaprine (FLEXERIL) 10 MG tablet Take 1 tablet (10 mg total) by mouth 2 (two) times daily as needed for muscle spasms. 12/26/18   Blanchie Dessert, MD  diclofenac sodium (VOLTAREN) 1 % GEL Apply 4 g topically 4 (four) times daily as needed. Apply to left elbow 01/06/19   Albrizze, Verline Lema E, PA-C  naproxen (NAPROSYN) 500 MG tablet Take 1 tablet (500 mg total) by mouth 2 (two) times daily. 12/26/18   Blanchie Dessert, MD  triamcinolone cream (KENALOG) 0.1 % Apply 1 application topically 2 (two) times daily. 01/27/17   Joy, Shawn C, PA-C    Allergies    Penicillins  Review of Systems   Review of Systems  Musculoskeletal: Positive for arthralgias and joint swelling.  All other systems reviewed and are negative.   Physical Exam Updated Vital Signs BP 128/82   Pulse 72   Temp 97.9 F (36.6 C) (Oral)   Resp 16   SpO2 99%   Physical Exam Constitutional:      Appearance: She is well-developed.  HENT:     Head: Normocephalic.     Nose: Nose normal.  Eyes:     General: Lids are normal.  Cardiovascular:     Rate and Rhythm: Normal rate.     Comments: 1+ radial pulse  bilaterally Pulmonary:     Effort: Pulmonary effort is normal. No respiratory distress.  Musculoskeletal:        General: Tenderness present. Normal range of motion.     Cervical back: Normal range of motion.     Comments: Tenderness along palmar aspect of base of thumb metacarpal, CMC joint with mild edema.  No tenderness here on the dorsal side.  Full ROM of thumb including flexion, extension, adduction, abduction and opposition with pain reported with flexion and extension.  Some crepitus with flexion/extension at [facility] joint.  No joint warmth, fluctuance. Skin otherwise normal.  No tenderness over hypothenar prominence, wrist bones, scaphoid. Negative Tinnel's, Phalen's, Finkelstein's tests.  Some tingling reported with Phalen's test along right thumb. IP thumb joint and distal thumb non  tender with full painless ROM. Non tender soft compartments of forearm. Non tender elbow, with full ROM  Neurological:     Mental Status: She is alert.     Comments: Sensation to light touch in radial, medial, ulnar nerve distribution intact in the RUE   Psychiatric:        Behavior: Behavior normal.     ED Results / Procedures / Treatments   Labs (all labs ordered are listed, but only abnormal results are displayed) Labs Reviewed - No data to display  EKG None  Radiology DG Finger Thumb Right  Result Date: 10/12/2019 CLINICAL DATA:  Right thumb pain for 2 months common no known injury, initial encounter EXAM: RIGHT THUMB 2+V COMPARISON:  None. FINDINGS: There is no evidence of fracture or dislocation. There is no evidence of arthropathy or other focal bone abnormality. Soft tissues are unremarkable. IMPRESSION: No acute abnormality noted. Electronically Signed   By: Inez Catalina M.D.   On: 10/12/2019 18:05    Procedures Procedures (including critical care time)  Medications Ordered in ED Medications - No data to display  ED Course  I have reviewed the triage vital signs and the nursing notes.  Pertinent labs & imaging results that were available during my care of the patient were reviewed by me and considered in my medical decision making (see chart for details).    MDM Rules/Calculators/A&P                      Highest on ddx is soft tissue injury like tendinitis vs strain vs nerve inflammation.  She describes some "locking" sensation of thumb and snaps straight so trigger finger also possible? 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  No signs of joint infection. NVI. Xray at triage personally reviewed and negative.  No associated proximal symptoms to suggest radiculopathy or elbow etiology.  Will dc with thumb spica, NSAID, ice, activity modification. Explained return to work may continue to flare up pain - supportive treatment discussed. Recommended PCP or  hand follow up as needed.  Return precautions given. Patient in agreement with POC.   Final Clinical Impression(s) / ED Diagnoses Final diagnoses:  Pain of right thumb    Rx / DC Orders ED Discharge Orders    None       Arlean Hopping 10/12/19 2021    Malvin Johns, MD 10/12/19 2111

## 2021-07-13 ENCOUNTER — Other Ambulatory Visit: Payer: Self-pay

## 2021-07-22 ENCOUNTER — Emergency Department (HOSPITAL_COMMUNITY): Payer: Self-pay

## 2021-07-22 ENCOUNTER — Other Ambulatory Visit: Payer: Self-pay

## 2021-07-22 ENCOUNTER — Encounter (HOSPITAL_COMMUNITY): Payer: Self-pay | Admitting: Emergency Medicine

## 2021-07-22 ENCOUNTER — Emergency Department (HOSPITAL_COMMUNITY)
Admission: EM | Admit: 2021-07-22 | Discharge: 2021-07-22 | Disposition: A | Discharge status: Home / Self Care | Payer: Self-pay | Attending: Emergency Medicine | Admitting: Emergency Medicine

## 2021-07-22 DIAGNOSIS — M25521 Pain in right elbow: Secondary | ICD-10-CM | POA: Insufficient documentation

## 2021-07-22 DIAGNOSIS — Z8542 Personal history of malignant neoplasm of other parts of uterus: Secondary | ICD-10-CM | POA: Insufficient documentation

## 2021-07-22 DIAGNOSIS — R531 Weakness: Secondary | ICD-10-CM | POA: Insufficient documentation

## 2021-07-22 NOTE — ED Provider Notes (Signed)
Emergency Medicine Provider Triage Evaluation Note  Lisa Boyd , a 45 y.o. female  was evaluated in triage.  Pt complains of right elbow pain.  Patient reports that she hit her elbow on a door approximately 1 month prior.  Patient has had pain to right elbow since then.  Patient also endorses tingling sensation to right elbow.  Patient is right-hand dominant.  Review of Systems  Positive: Arthralgia Negative: Numbness, weakness  Physical Exam  BP 137/68 (BP Location: Right Arm)   Pulse 74   Temp 97.9 F (36.6 C) (Oral)   Resp 16   SpO2 96%  Gen:   Awake, no distress   Resp:  Normal effort  MSK:   Diffuse TTP to right elbow.  Decreased range of motion to right elbow secondary to complaints of pain.  Pulse, motor, and sensation intact distally. Other:    Medical Decision Making  Medically screening exam initiated at 6:02 PM.  Appropriate orders placed.  Lisa Boyd was informed that the remainder of the evaluation will be completed by another provider, this initial triage assessment does not replace that evaluation, and the importance of remaining in the ED until their evaluation is complete.  Right elbow pain, x-ray imaging pending.   Loni Beckwith, PA-C 07/22/21 1803    Davonna Belling, MD 07/22/21 2212

## 2021-07-22 NOTE — ED Triage Notes (Signed)
Pt reports hitting her R elbow on a doorframe at home about a month ago, reports weakness and tingling.

## 2021-07-22 NOTE — ED Provider Notes (Signed)
[facility] EMERGENCY DEPARTMENT Provider Note   CSN: [id number] Arrival date & time: 07/22/21  1740     History Chief Complaint  Patient presents with   Elbow Pain    Lisa Boyd is a 45 y.o. female with no pertinent past medical history presenting today with right elbow pain.  Patient reports she was walking her elbow on a door frame about a month ago.  Her pain has not resolved with over-the-counter Tylenol as well as cold rags.  She denies any numbness or tingling down her arm.  Also does not feel as though the area is warm.  Reports that her arm and elbow sometimes itch.  Has not had a limit in her range of motion.  Pain is worse with lifting heavy objects.  Past Medical History:  Diagnosis Date   Uterine cancer ([place])    2016    There are no problems to display for this patient.   Past Surgical History:  Procedure Laterality Date   ABDOMINAL HYSTERECTOMY     APPENDECTOMY       OB History   No obstetric history on file.     No family history on file.  Social History   Tobacco Use   Smoking status: Never   Smokeless tobacco: Never  Substance Use Topics   Alcohol use: No   Drug use: No    Home Medications Prior to Admission medications   Medication Sig Start Date End Date Taking? Authorizing Provider  acetaminophen (TYLENOL) 500 MG tablet Take 1 tablet (500 mg total) by mouth every 6 (six) hours as needed. 03/24/19   Domenic Moras, PA-C  benzonatate (TESSALON) 100 MG capsule Take 1 capsule (100 mg total) by mouth every 8 (eight) hours. 03/24/19   Domenic Moras, PA-C  cyclobenzaprine (FLEXERIL) 10 MG tablet Take 1 tablet (10 mg total) by mouth 2 (two) times daily as needed for muscle spasms. 12/26/18   Blanchie Dessert, MD  diclofenac sodium (VOLTAREN) 1 % GEL Apply 4 g topically 4 (four) times daily as needed. Apply to left elbow 01/06/19   Sherol Dade E, PA-C  naproxen (NAPROSYN) 500 MG tablet Take 1 tablet (500 mg total) by  mouth 2 (two) times daily. 12/26/18   Blanchie Dessert, MD  triamcinolone cream (KENALOG) 0.1 % Apply 1 application topically 2 (two) times daily. 01/27/17   Joy, Shawn C, PA-C    Allergies    Penicillins  Review of Systems   Review of Systems  Musculoskeletal:  Positive for arthralgias.  Skin:  Negative for rash and wound.  Neurological:  Negative for numbness.  Psychiatric/Behavioral:  Negative for self-injury.    Physical Exam Updated Vital Signs BP 137/68 (BP Location: Right Arm)   Pulse 74   Temp 97.9 F (36.6 C) (Oral)   Resp 16   SpO2 96%   Physical Exam Vitals and nursing note reviewed.  Constitutional:      Appearance: Normal appearance.  HENT:     Head: Normocephalic and atraumatic.  Eyes:     General: No scleral icterus.    Conjunctiva/sclera: Conjunctivae normal.  Pulmonary:     Effort: Pulmonary effort is normal. No respiratory distress.  Musculoskeletal:        General: Tenderness present. No swelling, deformity or signs of injury. Normal range of motion.  Skin:    General: Skin is warm and dry.     Findings: No lesion or rash.  Neurological:     Mental Status: She is alert.  Sensory: No sensory deficit.     Motor: Weakness present.  Psychiatric:        Mood and Affect: Mood normal.        Behavior: Behavior normal.    ED Results / Procedures / Treatments   Labs (all labs ordered are listed, but only abnormal results are displayed) Labs Reviewed - No data to display  EKG None  Radiology DG Elbow Complete Right  Result Date: 07/22/2021 CLINICAL DATA:  Pain.  Injury. EXAM: RIGHT ELBOW - COMPLETE 3+ VIEW COMPARISON:  None. FINDINGS: There is no evidence of fracture, dislocation, or joint effusion. There is no evidence of arthropathy or other focal bone abnormality. Soft tissues are unremarkable. IMPRESSION: Negative. Electronically Signed   By: Ronney Asters M.D.   On: 07/22/2021 18:53    Procedures Procedures   Medications Ordered in  ED Medications - No data to display  ED Course  I have reviewed the triage vital signs and the nursing notes.  Pertinent labs & imaging results that were available during my care of the patient were reviewed by me and considered in my medical decision making (see chart for details).    MDM Rules/Calculators/A&P I evaluated the patient in the hallway.  She reported that the majority of her pain is with elbow flexion.  No tenderness to biceps or triceps.  Her range of motion was ultimately not limited by this pain.  She had decreased strength when testing elbow extension, which I believe to be secondary to her pain.  Her elbow is tender to palpation, but she is without wound or swelling.  No sensation deficit and I am not concerned for a septic joint due to my thorough history and her physical exam.  An x-ray was obtained which was negative.  Patient was notified of this.  We discussed that she may follow-up with a sports medicine provider who may put her in physical therapy to increase the functionality and decrease the pain of her right elbow.  She and her daughter are agreeable to this plan.  Final Clinical Impression(s) / ED Diagnoses Final diagnoses:  Right elbow pain    Rx / DC Orders Results and diagnoses were explained to the patient. Return precautions discussed in full. Patient had no additional questions and expressed complete understanding.     Darliss Ridgel 07/22/21 Otis Peak, MD 07/22/21 2212

## 2021-07-22 NOTE — Discharge Instructions (Addendum)
Your x-ray is normal today.  I believe you should follow-up with sports medicine or orthopedic provider to discuss potential options for physical therapy.  An office is attached to these papers for you to call.  It was a pleasure to meet you and I hope that you feel better.

## 2023-01-16 IMAGING — DX DG ELBOW COMPLETE 3+V*R*
4 series · 4 of 4 positions shown · non-contrast
Comparison: None.

CLINICAL DATA: Pain.  Injury.

EXAM:
RIGHT ELBOW - COMPLETE 3+ VIEW

[elbow ap]
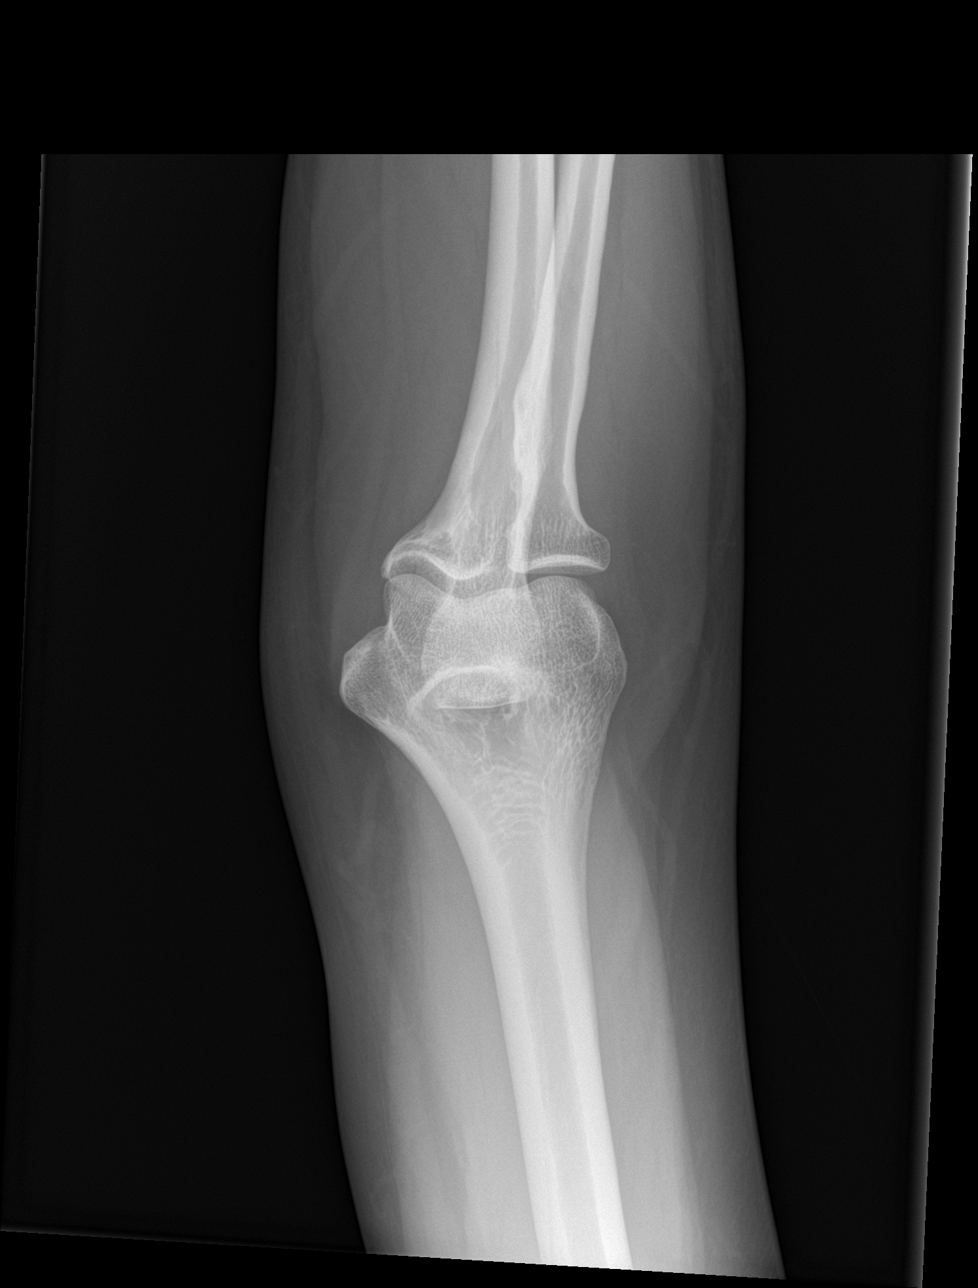

[elbow obl (1 of 2)]
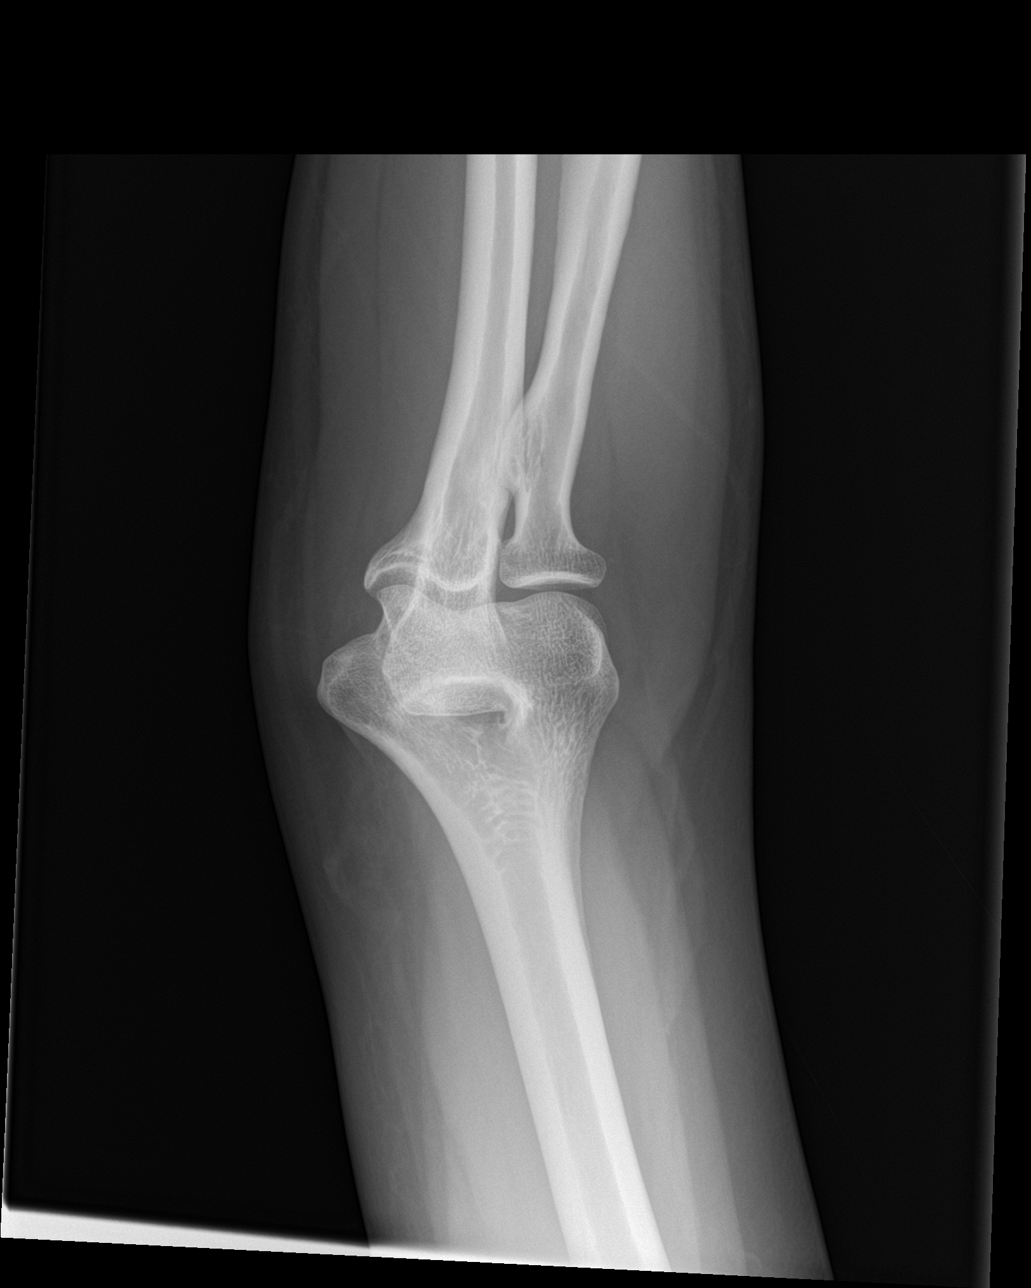

[elbow obl (2 of 2)]
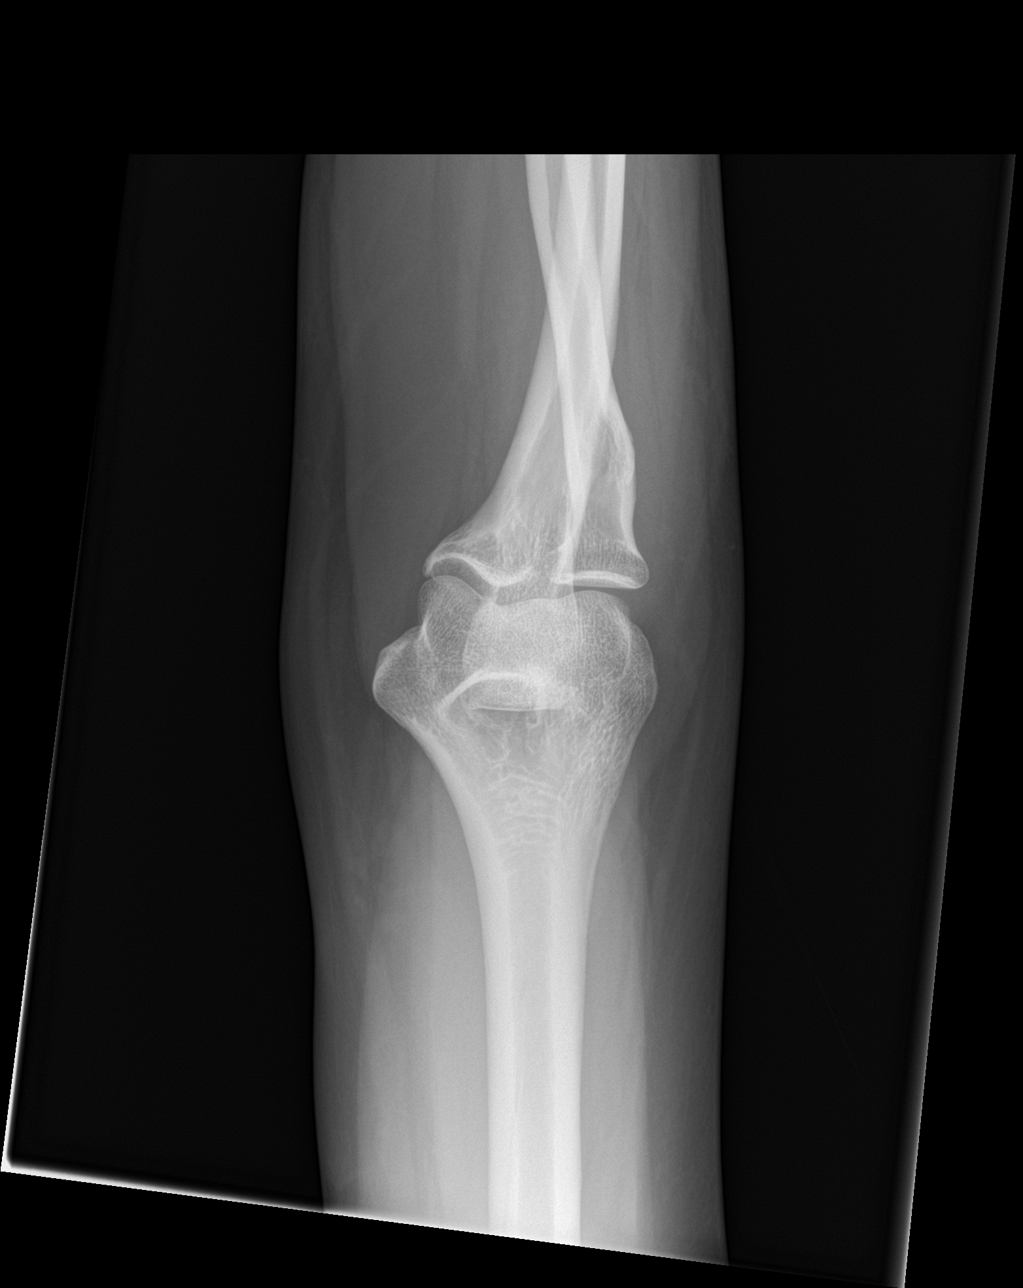

[elbow lat]
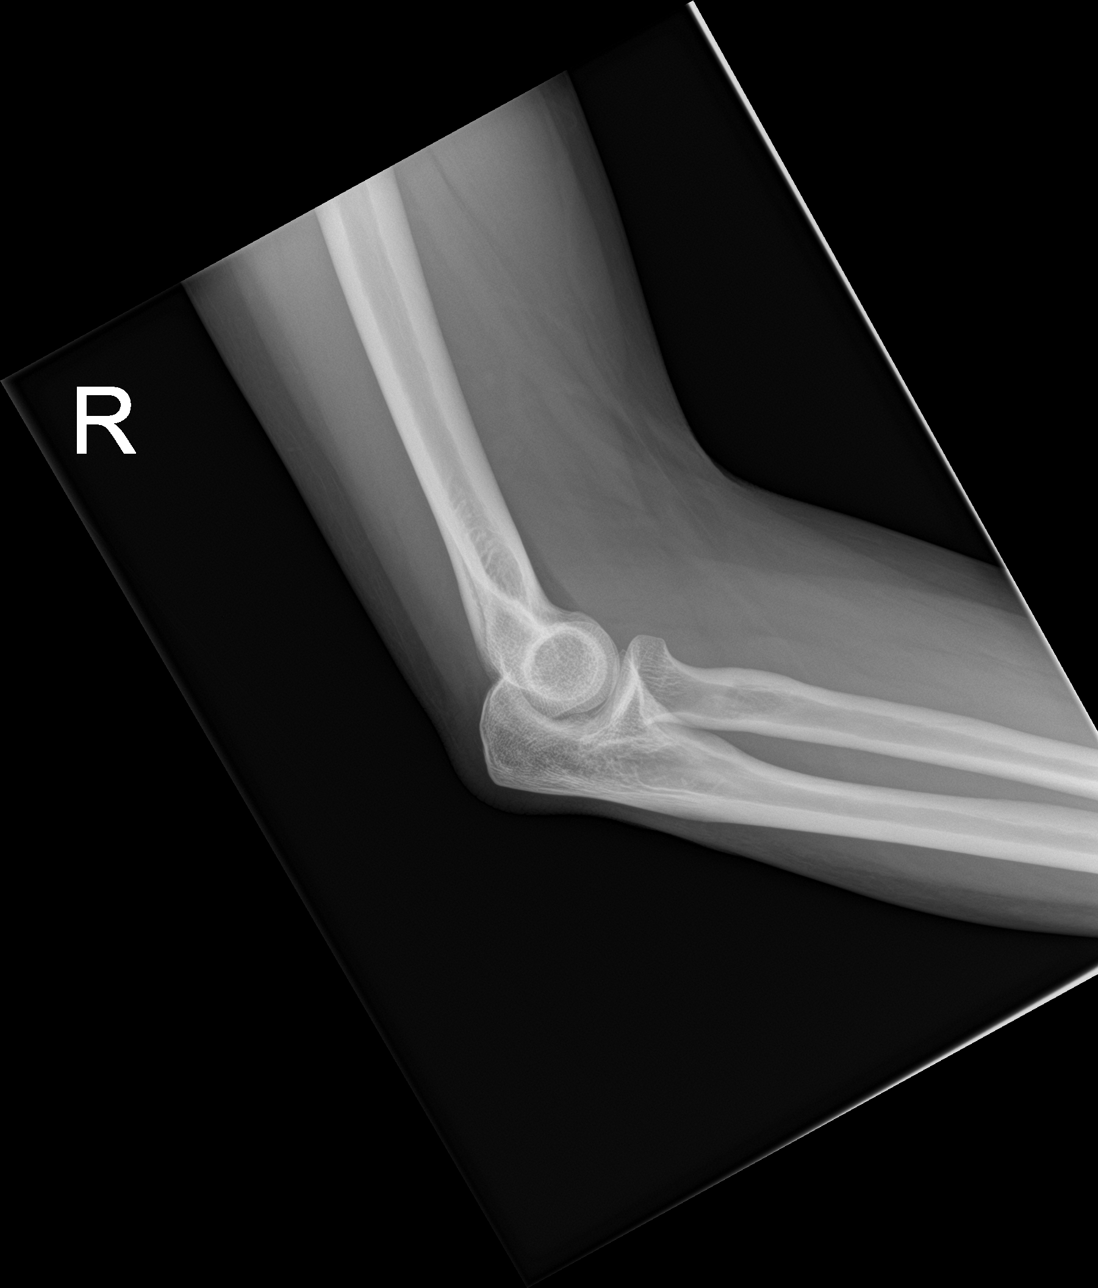

[4 of 4 positions shown; findings below may reference images not displayed]

FINDINGS: There is no evidence of fracture, dislocation, or joint effusion.
There is no evidence of arthropathy or other focal bone abnormality.
Soft tissues are unremarkable.
IMPRESSION: Negative.

## 2023-12-27 ENCOUNTER — Other Ambulatory Visit: Payer: Self-pay

## 2023-12-27 ENCOUNTER — Emergency Department (HOSPITAL_COMMUNITY)
Admission: EM | Admit: 2023-12-27 | Discharge: 2023-12-27 | Disposition: A | Discharge status: Home / Self Care | Payer: Self-pay | Attending: Emergency Medicine | Admitting: Emergency Medicine

## 2023-12-27 ENCOUNTER — Emergency Department (HOSPITAL_COMMUNITY): Payer: Self-pay

## 2023-12-27 DIAGNOSIS — R197 Diarrhea, unspecified: Secondary | ICD-10-CM | POA: Insufficient documentation

## 2023-12-27 DIAGNOSIS — R1013 Epigastric pain: Secondary | ICD-10-CM | POA: Insufficient documentation

## 2023-12-27 DIAGNOSIS — J029 Acute pharyngitis, unspecified: Secondary | ICD-10-CM | POA: Insufficient documentation

## 2023-12-27 DIAGNOSIS — R509 Fever, unspecified: Secondary | ICD-10-CM | POA: Insufficient documentation

## 2023-12-27 DIAGNOSIS — N3000 Acute cystitis without hematuria: Secondary | ICD-10-CM

## 2023-12-27 DIAGNOSIS — R112 Nausea with vomiting, unspecified: Secondary | ICD-10-CM | POA: Insufficient documentation

## 2023-12-27 DIAGNOSIS — R7401 Elevation of levels of liver transaminase levels: Secondary | ICD-10-CM | POA: Insufficient documentation

## 2023-12-27 DIAGNOSIS — R1032 Left lower quadrant pain: Secondary | ICD-10-CM | POA: Insufficient documentation

## 2023-12-27 DIAGNOSIS — M791 Myalgia, unspecified site: Secondary | ICD-10-CM | POA: Insufficient documentation

## 2023-12-27 LAB — RESP PANEL BY RT-PCR (RSV, FLU A&B, COVID)  RVPGX2
Influenza A by PCR: NEGATIVE
Influenza B by PCR: NEGATIVE
Resp Syncytial Virus by PCR: NEGATIVE
SARS Coronavirus 2 by RT PCR: NEGATIVE

## 2023-12-27 LAB — URINALYSIS, ROUTINE W REFLEX MICROSCOPIC
Bilirubin Urine: NEGATIVE
Glucose, UA: NEGATIVE mg/dL
Hgb urine dipstick: NEGATIVE
Ketones, ur: NEGATIVE mg/dL
Nitrite: NEGATIVE
Protein, ur: NEGATIVE mg/dL
Specific Gravity, Urine: 1.024 (ref 1.005–1.030)
pH: 5 (ref 5.0–8.0)

## 2023-12-27 LAB — CBC
HCT: 40.2 % (ref 36.0–46.0)
Hemoglobin: 13.4 g/dL (ref 12.0–15.0)
MCH: 29.5 pg (ref 26.0–34.0)
MCHC: 33.3 g/dL (ref 30.0–36.0)
MCV: 88.4 fL (ref 80.0–100.0)
Platelets: 194 10*3/uL (ref 150–400)
RBC: 4.55 MIL/uL (ref 3.87–5.11)
RDW: 13.5 % (ref 11.5–15.5)
WBC: 7.3 10*3/uL (ref 4.0–10.5)
nRBC: 0 % (ref 0.0–0.2)

## 2023-12-27 LAB — HCG, SERUM, QUALITATIVE: Preg, Serum: NEGATIVE

## 2023-12-27 LAB — COMPREHENSIVE METABOLIC PANEL
ALT: 26 U/L (ref 0–44)
AST: 20 U/L (ref 15–41)
Albumin: 4 g/dL (ref 3.5–5.0)
Alkaline Phosphatase: 65 U/L (ref 38–126)
Anion gap: 8 (ref 5–15)
BUN: 16 mg/dL (ref 6–20)
CO2: 24 mmol/L (ref 22–32)
Calcium: 9.3 mg/dL (ref 8.9–10.3)
Chloride: 106 mmol/L (ref 98–111)
Creatinine, Ser: 0.66 mg/dL (ref 0.44–1.00)
GFR, Estimated: >60 mL/min (ref >60)
Glucose, Bld: 102 mg/dL — ABNORMAL HIGH (ref 70–99)
Potassium: 4.1 mmol/L (ref 3.5–5.1)
Sodium: 138 mmol/L (ref 135–145)
Total Bilirubin: 0.9 mg/dL (ref 0.0–1.2)
Total Protein: 6.9 g/dL (ref 6.5–8.1)

## 2023-12-27 LAB — LIPASE, BLOOD: Lipase: 29 U/L (ref 11–51)

## 2023-12-27 MED ORDER — MORPHINE SULFATE (PF) 2 MG/ML IV SOLN
2.0000 mg | Freq: Once | INTRAVENOUS | Status: AC
  Administered 2023-12-27: 2 mg via INTRAVENOUS
  Filled 2023-12-27: qty 1

## 2023-12-27 MED ORDER — IOHEXOL 350 MG/ML SOLN
75.0000 mL | Freq: Once | INTRAVENOUS | Status: AC | PRN
  Administered 2023-12-27: 75 mL via INTRAVENOUS

## 2023-12-27 MED ORDER — NITROFURANTOIN MONOHYD MACRO 100 MG PO CAPS: 100.0000 mg | ORAL_CAPSULE | Freq: Two times a day (BID) | ORAL | 0 refills | Status: AC

## 2023-12-27 MED ORDER — ONDANSETRON HCL 4 MG/2ML IJ SOLN
4.0000 mg | Freq: Once | INTRAMUSCULAR | Status: AC
  Administered 2023-12-27: 4 mg via INTRAVENOUS
  Filled 2023-12-27: qty 2

## 2023-12-27 NOTE — ED Notes (Signed)
 Lab called to notify of hcg qualitive add on

## 2023-12-27 NOTE — ED Provider Notes (Signed)
 [place] EMERGENCY DEPARTMENT AT [facility] Provider Note   CSN: [id number] Arrival date & time: 12/27/23  1307     History No chief complaint on file.   Lisa Boyd is a 48 y.o. female with medical history of appendectomy.  Patient presents to ED for evaluation of abdominal pain, nausea, vomiting, diarrhea and sore throat, body aches and chills.  Reports all of her symptoms began 2 days ago.  She is endorsing nausea, vomiting and diarrhea.  Also endorsing abdominal pain that is located primarily to her suprapubic region.  Also complaining of bodyaches and chills, sore throat.  She denies sick contacts.  She reports that she was up all night with a fever.  Denies dysuria, flank pain, vaginal discharge.  Denies medications prior to arrival.  Denies chest pain or shortness of breath.  HPI     Home Medications Prior to Admission medications   Not on File      Allergies    Penicillins and Shellfish allergy    Review of Systems   Review of Systems  Constitutional:  Positive for fever.  HENT:  Positive for sore throat.   Gastrointestinal:  Positive for abdominal pain, diarrhea, nausea and vomiting.  All other systems reviewed and are negative.   Physical Exam Updated Vital Signs BP 115/68   Pulse 83   Temp 98.5 F (36.9 C)   Resp 15   Ht 5\' 6"  (1.676 m)   Wt 71.2 kg   SpO2 100%   BMI 25.34 kg/m  Physical Exam Vitals and nursing note reviewed.  Constitutional:      General: She is not in acute distress.    Appearance: She is well-developed.  HENT:     Head: Normocephalic and atraumatic.  Eyes:     Conjunctiva/sclera: Conjunctivae normal.  Cardiovascular:     Rate and Rhythm: Normal rate and regular rhythm.     Heart sounds: No murmur heard. Pulmonary:     Effort: Pulmonary effort is normal. No respiratory distress.     Breath sounds: Normal breath sounds.  Abdominal:     Palpations: Abdomen is soft.     Tenderness: There is abdominal  tenderness. There is no right CVA tenderness or left CVA tenderness.     Comments: Left lower quadrant, epigastric TTP.  No overlying skin change.  No rebound or guarding.  No CVA tenderness.  Musculoskeletal:        General: No swelling.     Cervical back: Neck supple.  Skin:    General: Skin is warm and dry.     Capillary Refill: Capillary refill takes less than 2 seconds.  Neurological:     Mental Status: She is alert and oriented to person, place, and time. Mental status is at baseline.  Psychiatric:        Mood and Affect: Mood normal.     ED Results / Procedures / Treatments   Labs (all labs ordered are listed, but only abnormal results are displayed) Labs Reviewed  COMPREHENSIVE METABOLIC PANEL - Abnormal; Notable for the following components:      Result Value   Glucose, Bld 102 (*)    All other components within normal limits  URINALYSIS, ROUTINE W REFLEX MICROSCOPIC - Abnormal; Notable for the following components:   Color, Urine AMBER (*)    APPearance CLOUDY (*)    Leukocytes,Ua MODERATE (*)    Bacteria, UA MANY (*)    All other components within normal limits  RESP PANEL  BY RT-PCR (RSV, FLU A&B, COVID)  RVPGX2  LIPASE, BLOOD  CBC    EKG None  Radiology No results found.  Procedures Procedures   Medications Ordered in ED Medications  ondansetron (ZOFRAN) injection 4 mg (4 mg Intravenous Given 12/27/23 1502)    ED Course/ Medical Decision Making/ A&P  Medical Decision Making Amount and/or Complexity of Data Reviewed Labs: ordered. Radiology: ordered.  Risk Prescription drug management.   48 year old female presents for evaluation.  Please see HPI for further details.  On examination patient is afebrile, nontachycardic.  Lung sounds are clear bilaterally, not hypoxic.  Abdomen has tenderness in the left lower quadrant, epigastric region.  No overlying skin change.  No rebound or guarding.  Neurological examinations at baseline.  Will collect  CBC, CMP, lipase, viral panel, urinalysis, CT abdomen pelvis due to tenderness in abdomen.  Will provide patient Zofran.  CBC without leukocytosis or anemia.  Metabolic panel without electrolyte derangement or elevated LFTs.  Lipase 29, WNL.  Urinalysis shows moderate leukocytes and many bacteria but the patient denies dysuria or flank pain.  Her viral panel is pending.  Her CT scan is pending.  Patient signed out to oncoming provider Zelaya PA-C pending imaging.   Final Clinical Impression(s) / ED Diagnoses Final diagnoses:  Epigastric pain  Nausea vomiting and diarrhea    Rx / DC Orders ED Discharge Orders     None         Clent Ridges 12/27/23 1515    Lorre Nick, MD 12/28/23 1017

## 2023-12-27 NOTE — ED Provider Notes (Signed)
 Accepted handoff at shift change from [place], [place]. Please see prior provider note for more detail.   Briefly: Patient is 48 y.o.   DDX: concern for abdominal abscess, diverticulitis, bowel obstruction, pyelonephritis  Plan: Pending CTAP for disposition. If clear, consider possible abx treatment given suprapubic pain but no clear urinary symptoms at this time.   Physical Exam  BP 110/67 (BP Location: Right Arm)   Pulse 78   Temp 98.2 F (36.8 C) (Oral)   Resp 16   Ht 5\' 6"  (1.676 m)   Wt 71.2 kg   SpO2 99%   BMI 25.34 kg/m   Physical Exam Vitals and nursing note reviewed.  Constitutional:      General: She is not in acute distress.    Appearance: She is well-developed.  HENT:     Head: Normocephalic and atraumatic.  Eyes:     Conjunctiva/sclera: Conjunctivae normal.  Cardiovascular:     Rate and Rhythm: Normal rate and regular rhythm.     Heart sounds: No murmur heard. Pulmonary:     Effort: Pulmonary effort is normal. No respiratory distress.     Breath sounds: Normal breath sounds.  Abdominal:     Palpations: Abdomen is soft.     Tenderness: There is abdominal tenderness. There is no right CVA tenderness or left CVA tenderness.     Comments: Left lower quadrant, epigastric TTP.  No overlying skin change.  No rebound or guarding.  No CVA tenderness.  Musculoskeletal:        General: No swelling.     Cervical back: Neck supple.  Skin:    General: Skin is warm and dry.     Capillary Refill: Capillary refill takes less than 2 seconds.  Neurological:     Mental Status: She is alert and oriented to person, place, and time. Mental status is at baseline.  Psychiatric:        Mood and Affect: Mood normal.     Procedures  Procedures  ED Course / MDM   Clinical Course as of 12/27/23 1855  Tue Dec 27, 2023  1544 Sign out: Pending CTAP. Some LLQ/suprapubic tenderness, but no urinary symptoms. Dispo per results and if antibiotics needed. [OZ]    Clinical Course User  Index [OZ] Smitty Knudsen, PA-C   Medical Decision Making Amount and/or Complexity of Data Reviewed Labs: ordered. Radiology: ordered.  Risk Prescription drug management.   CTAP is clear. No signs of any clear abnormalities and trace free fluid in the pelvis is seen. UA with some concern for possible infection and given leukocytes and bacteria present although contamination likely, discussed possible treatment with antibiotics. Patient agreeable with this plan and verbalized understanding return precautions. Discharged home in stable condition.       Smitty Knudsen, PA-C 12/27/23 1855    Terrilee Files, MD 12/28/23 [phone]

## 2023-12-27 NOTE — ED Notes (Signed)
 Patient transported to CT

## 2023-12-27 NOTE — Discharge Instructions (Signed)
  Hoy lo atendieron en urgencias por nuseas, vmitos y diarrea, adems de dolor abdominal. Afortunadamente, sus anlisis e imgenes fueron tranquilizadores; sin embargo, su orina mostr indicios de una posible infeccin. Dado el dolor en la zona por encima de la vejiga, consideramos iniciar antibiticos, que usted prefiri. Por favor, tmelos segn lo prescrito durante los prximos 5 das. Si le preocupa la aparicin o el empeoramiento de los sntomas, regrese a Oceanographer.  You were seen in the ER today for concerns of nausea, vomiting, and diarrhea as well as abdominal pain. Your labs and imaging were thankfully reassuring, however your urine did have some concerns for possible infection. Given your area of pain above the bladder, we discussed starting antibiotics which you preferred to start. Please take these as prescribed for the next 5 days. For any concerns of new or worsening symptoms, return to the ER.

## 2023-12-27 NOTE — ED Triage Notes (Signed)
 Pt here with c/o NVD and abd pain, sore throat,. Started 2 days ago. Pt has not had sick contacts. Denies cp sob cough. Emesis x4 today and has had chills and body aches today.

## 2023-12-27 NOTE — ED Notes (Signed)
 Pt understood d/c instructions and when to return to the ED. IV d/c and VS retaken. Pt left w/ significant other to go home

## 2024-01-03 ENCOUNTER — Emergency Department (HOSPITAL_COMMUNITY)
Admission: EM | Admit: 2024-01-03 | Discharge: 2024-01-03 | Disposition: A | Discharge status: Home / Self Care | Payer: Self-pay | Attending: Emergency Medicine | Admitting: Emergency Medicine

## 2024-01-03 ENCOUNTER — Emergency Department (HOSPITAL_COMMUNITY): Payer: Self-pay

## 2024-01-03 ENCOUNTER — Other Ambulatory Visit: Payer: Self-pay

## 2024-01-03 DIAGNOSIS — M25511 Pain in right shoulder: Secondary | ICD-10-CM | POA: Insufficient documentation

## 2024-01-03 MED ORDER — CYCLOBENZAPRINE HCL 10 MG PO TABS
10.0000 mg | ORAL_TABLET | Freq: Once | ORAL | Status: AC
  Administered 2024-01-03: 10 mg via ORAL
  Filled 2024-01-03: qty 1

## 2024-01-03 MED ORDER — KETOROLAC TROMETHAMINE 15 MG/ML IJ SOLN
15.0000 mg | Freq: Once | INTRAMUSCULAR | Status: AC
  Administered 2024-01-03: 15 mg via INTRAMUSCULAR
  Filled 2024-01-03: qty 1

## 2024-01-03 MED ORDER — NAPROXEN 500 MG PO TABS: 500.0000 mg | ORAL_TABLET | Freq: Two times a day (BID) | ORAL | 0 refills | Status: AC

## 2024-01-03 MED ORDER — METHOCARBAMOL 500 MG PO TABS: 500.0000 mg | ORAL_TABLET | Freq: Two times a day (BID) | ORAL | 0 refills | Status: DC

## 2024-01-03 NOTE — ED Triage Notes (Addendum)
 Translator/pt stated, I have rt. Shoulder pain fro 2 weeks. I hurt my elbow last year but went away but went to my shoulder. I saw someone about my elbow and said it was inflammation

## 2024-01-03 NOTE — ED Provider Notes (Signed)
 [place] EMERGENCY DEPARTMENT AT [facility] Provider Note   CSN: [id number] Arrival date & time: 01/03/24  4782     History  Chief Complaint  Patient presents with   Shoulder Pain    Lisa Boyd is a 48 y.o. female.  48 y.o female with a PMH of right elbow pain presents to the ED with a chief complaint of right shoulder pain which began approximately 2 weeks ago.  Patient describes this as a stiffness sensation exacerbated with any type of movement.  She has tried Tylenol, over-the-counter medication without any improvement in her symptoms.  No fever, no falls, no other complaints.  The history is provided by the patient.  Shoulder Pain Associated symptoms: no fever        Home Medications Prior to Admission medications   Medication Sig Start Date End Date Taking? Authorizing Provider  acetaminophen (TYLENOL) 500 MG tablet Take 1,000 mg by mouth every 6 (six) hours as needed for moderate pain (pain score 4-6) or mild pain (pain score 1-3).   Yes [provider]  methocarbamol (ROBAXIN) 500 MG tablet Take 1 tablet (500 mg total) by mouth 2 (two) times daily. 01/03/24  Yes Hilton Saephan, Leonie Douglas, PA-C  naproxen (NAPROSYN) 500 MG tablet Take 1 tablet (500 mg total) by mouth 2 (two) times daily. 01/03/24  Yes Gawain Crombie, Leonie Douglas, PA-C      Allergies    Penicillins and Shellfish allergy    Review of Systems   Review of Systems  Constitutional:  Negative for chills and fever.  Respiratory:  Negative for shortness of breath.   Cardiovascular:  Negative for chest pain.  Musculoskeletal:  Positive for arthralgias.    Physical Exam Updated Vital Signs BP (!) 121/47 (BP Location: Left Arm)   Pulse 65   Temp 98.1 F (36.7 C)   Resp 17   SpO2 99%  Physical Exam Vitals and nursing note reviewed.  Constitutional:      Appearance: Normal appearance.  HENT:     Head: Normocephalic and atraumatic.     Nose: Nose normal.  Eyes:     Pupils: Pupils are equal, round,  and reactive to light.  Cardiovascular:     Rate and Rhythm: Normal rate.  Pulmonary:     Effort: Pulmonary effort is normal.  Abdominal:     General: Abdomen is flat.  Musculoskeletal:        General: Tenderness present.     Right shoulder: Tenderness and bony tenderness present. Decreased range of motion. Decreased strength. Normal pulse.     Left shoulder: No tenderness or bony tenderness. Normal range of motion. Normal pulse.     Cervical back: Normal range of motion and neck supple.     Comments: Difficulty with range of motion of the right shoulder, there is some bony tenderness along the humeral head.  Some decreased strength with hand flexion.  Skin:    General: Skin is warm and dry.  Neurological:     Mental Status: She is alert and oriented to person, place, and time.     ED Results / Procedures / Treatments   Labs (all labs ordered are listed, but only abnormal results are displayed) Labs Reviewed - No data to display  EKG None  Radiology DG Shoulder Right Result Date: 01/03/2024 CLINICAL DATA:  Right shoulder pain. EXAM: RIGHT SHOULDER - 2+ VIEW COMPARISON:  None Available. FINDINGS: There is no evidence of fracture or dislocation. There is no evidence of arthropathy or other  focal bone abnormality. Soft tissues are unremarkable. IMPRESSION: Negative. Electronically Signed   By: Hart Robinsons M.D.   On: 01/03/2024 12:43    Procedures Procedures    Medications Ordered in ED Medications  ketorolac (TORADOL) 15 MG/ML injection 15 mg (15 mg Intramuscular Given 01/03/24 1238)  cyclobenzaprine (FLEXERIL) tablet 10 mg (10 mg Oral Given 01/03/24 1236)    ED Course/ Medical Decision Making/ A&P                                 Medical Decision Making Amount and/or Complexity of Data Reviewed Radiology: ordered.  Risk Prescription drug management.    Patient presented to ED with right shoulder pain has been ongoing for the past 2 weeks, prior history of right  elbow pain diagnosed with inflammation?  She was given a prescription for anti-inflammatories which help resolve her pain.  On today's visit she has difficulty ranging of the right shoulder with active and passive range of motion.  Pulses are present, capillary refill is intact and she has got good hand flexion and extension.  X-ray obtained in triage did not show any fracture or dislocation, she denies any falls.  No fever to suggest a septic joint.  Given muscle relaxer, anti-inflammatory with improvement in her range of motion.  We discussed short course of anti-inflammatories along with muscle relaxers to help with pain control, will follow-up with orthopedist as needed.   Portions of this note were generated with Scientist, clinical (histocompatibility and immunogenetics). Dictation errors may occur despite best attempts at proofreading.  Final Clinical Impression(s) / ED Diagnoses Final diagnoses:  Acute pain of right shoulder    Rx / DC Orders ED Discharge Orders          Ordered    naproxen (NAPROSYN) 500 MG tablet  2 times daily        01/03/24 1258    methocarbamol (ROBAXIN) 500 MG tablet  2 times daily        01/03/24 [address], PA-C 01/03/24 1319    Pricilla Loveless, MD 01/03/24 1501

## 2024-01-03 NOTE — Discharge Instructions (Addendum)
 Le he recetado medicina para Chief Technology Officer, tome esta medicina como esta indicada.   Tambien puede ponerse algo caliente y algo frio para ayudar con Chief Technology Officer.  Haga una cita con el orthopedista para el seguimiento de su dolor de hombro derecho.

## 2024-01-26 ENCOUNTER — Emergency Department (HOSPITAL_COMMUNITY)
Admission: EM | Admit: 2024-01-26 | Discharge: 2024-01-27 | Discharge status: Against Medical Advice | Payer: Self-pay | Attending: Emergency Medicine | Admitting: Emergency Medicine

## 2024-01-26 ENCOUNTER — Encounter (HOSPITAL_COMMUNITY): Payer: Self-pay | Admitting: Emergency Medicine

## 2024-01-26 ENCOUNTER — Other Ambulatory Visit: Payer: Self-pay

## 2024-01-26 DIAGNOSIS — R509 Fever, unspecified: Secondary | ICD-10-CM | POA: Insufficient documentation

## 2024-01-26 DIAGNOSIS — Z5321 Procedure and treatment not carried out due to patient leaving prior to being seen by health care provider: Secondary | ICD-10-CM | POA: Insufficient documentation

## 2024-01-26 DIAGNOSIS — R319 Hematuria, unspecified: Secondary | ICD-10-CM | POA: Insufficient documentation

## 2024-01-26 LAB — URINALYSIS, W/ REFLEX TO CULTURE (INFECTION SUSPECTED)
Bacteria, UA: NONE SEEN
Bilirubin Urine: NEGATIVE
Glucose, UA: NEGATIVE mg/dL
Hgb urine dipstick: NEGATIVE
Ketones, ur: NEGATIVE mg/dL
Leukocytes,Ua: NEGATIVE
Nitrite: POSITIVE — AB
Protein, ur: NEGATIVE mg/dL
Specific Gravity, Urine: 1.008 (ref 1.005–1.030)
pH: 7 (ref 5.0–8.0)

## 2024-01-26 LAB — CBC
HCT: 40.9 % (ref 36.0–46.0)
Hemoglobin: 13.3 g/dL (ref 12.0–15.0)
MCH: 29.4 pg (ref 26.0–34.0)
MCHC: 32.5 g/dL (ref 30.0–36.0)
MCV: 90.5 fL (ref 80.0–100.0)
Platelets: 179 10*3/uL (ref 150–400)
RBC: 4.52 MIL/uL (ref 3.87–5.11)
RDW: 13.3 % (ref 11.5–15.5)
WBC: 5.5 10*3/uL (ref 4.0–10.5)
nRBC: 0 % (ref 0.0–0.2)

## 2024-01-26 LAB — BASIC METABOLIC PANEL WITH GFR
Anion gap: 12 (ref 5–15)
BUN: 19 mg/dL (ref 6–20)
CO2: 22 mmol/L (ref 22–32)
Calcium: 9.6 mg/dL (ref 8.9–10.3)
Chloride: 106 mmol/L (ref 98–111)
Creatinine, Ser: 1.1 mg/dL — ABNORMAL HIGH (ref 0.44–1.00)
GFR, Estimated: >60 mL/min (ref >60)
Glucose, Bld: 146 mg/dL — ABNORMAL HIGH (ref 70–99)
Potassium: 3.9 mmol/L (ref 3.5–5.1)
Sodium: 140 mmol/L (ref 135–145)

## 2024-01-26 NOTE — ED Triage Notes (Signed)
 Pt reports she has had a fever and blood in her urine for a few days.  She also states she has "a pimple on my private parts that has puss."  States it hurts when she walks.  Took tylenol  an hour ago.

## 2024-01-27 NOTE — ED Notes (Signed)
 Pt called 3x for vitals, no answer.

## 2024-03-29 ENCOUNTER — Other Ambulatory Visit: Payer: Self-pay

## 2024-03-29 ENCOUNTER — Emergency Department (HOSPITAL_COMMUNITY)
Admission: EM | Admit: 2024-03-29 | Discharge: 2024-03-29 | Disposition: A | Discharge status: Home / Self Care | Payer: Self-pay | Attending: Emergency Medicine | Admitting: Emergency Medicine

## 2024-03-29 ENCOUNTER — Encounter (HOSPITAL_COMMUNITY): Payer: Self-pay

## 2024-03-29 DIAGNOSIS — X58XXXA Exposure to other specified factors, initial encounter: Secondary | ICD-10-CM | POA: Insufficient documentation

## 2024-03-29 DIAGNOSIS — S161XXA Strain of muscle, fascia and tendon at neck level, initial encounter: Secondary | ICD-10-CM | POA: Insufficient documentation

## 2024-03-29 MED ORDER — KETOROLAC TROMETHAMINE 15 MG/ML IJ SOLN
15.0000 mg | Freq: Once | INTRAMUSCULAR | Status: AC
  Administered 2024-03-29: 15 mg via INTRAMUSCULAR
  Filled 2024-03-29: qty 1

## 2024-03-29 MED ORDER — LIDOCAINE 5 % EX PTCH
1.0000 | MEDICATED_PATCH | CUTANEOUS | Status: DC
  Administered 2024-03-29: 1 via TRANSDERMAL
  Filled 2024-03-29: qty 1

## 2024-03-29 MED ORDER — METHOCARBAMOL 500 MG PO TABS
500.0000 mg | ORAL_TABLET | Freq: Once | ORAL | Status: AC
  Administered 2024-03-29: 500 mg via ORAL
  Filled 2024-03-29: qty 1

## 2024-03-29 MED ORDER — METHOCARBAMOL 500 MG PO TABS: 500.0000 mg | ORAL_TABLET | Freq: Two times a day (BID) | ORAL | 0 refills | Status: AC

## 2024-03-29 NOTE — ED Notes (Signed)
 Pt left without discharge paperwork.

## 2024-03-29 NOTE — ED Triage Notes (Signed)
 Pt states she has had neck pain for 3 days. Pt denies recent trauma/ change in activity. Pt ambulatory.

## 2024-03-29 NOTE — ED Provider Notes (Addendum)
 [place] EMERGENCY DEPARTMENT AT [facility] Provider Note   CSN: [id number] Arrival date & time: 03/29/24  9197     Patient presents with: Neck Pain   Lisa Boyd is a 48 y.o. female with non-contributory PMHx who presents to ED concerned for left sided neck pain x3 days. Symptoms started when patient was attempting to get out of bed, so she had to lay back down and her daughter eventually was able to assist her out of the bed. Patient has been taking Tylenol  which is providing some relief - but does not completely resolve symptoms. Last dose of Tylenol  was yesterday afternoon. Patient has also been applying heat to the area. No other new symptoms today. No recent falls or severe trauma.   Interview assisted with Spanish Interpreter Erminio [phone].    Neck Pain      Prior to Admission medications   Medication Sig Start Date End Date Taking? Authorizing Provider  acetaminophen  (TYLENOL ) 500 MG tablet Take 1,000 mg by mouth every 6 (six) hours as needed for moderate pain (pain score 4-6) or mild pain (pain score 1-3).    [provider]  methocarbamol  (ROBAXIN ) 500 MG tablet Take 1 tablet (500 mg total) by mouth 2 (two) times daily for 10 doses. 03/29/24 04/03/24  Hoy Nidia FALCON, PA-C  naproxen  (NAPROSYN ) 500 MG tablet Take 1 tablet (500 mg total) by mouth 2 (two) times daily. 01/03/24   Soto, Johana, PA-C    Allergies: Penicillins and Shellfish allergy    Review of Systems  Musculoskeletal:  Positive for neck pain.    Updated Vital Signs BP 120/80 (BP Location: Right Arm)   Pulse (!) 58   Temp 98.4 F (36.9 C)   Resp 14   Ht 5' 5 (1.651 m)   Wt 67.6 kg   SpO2 100%   BMI 24.79 kg/m   Physical Exam Vitals and nursing note reviewed.  Constitutional:      General: She is not in acute distress.    Appearance: She is not ill-appearing or toxic-appearing.  HENT:     Head: Normocephalic and atraumatic.   Eyes:     General: No scleral  icterus.       Right eye: No discharge.        Left eye: No discharge.     Conjunctiva/sclera: Conjunctivae normal.   Neck:      Comments: Left middle and upper trapezius tender to palpation. No midline tenderness or right sided tenderness to palpation. Cardiovascular:     Rate and Rhythm: Normal rate.  Pulmonary:     Effort: Pulmonary effort is normal.  Abdominal:     General: Abdomen is flat.   Skin:    General: Skin is warm and dry.   Neurological:     General: No focal deficit present.     Mental Status: She is alert. Mental status is at baseline.   Psychiatric:        Mood and Affect: Mood normal.        Behavior: Behavior normal.     (all labs ordered are listed, but only abnormal results are displayed) Labs Reviewed - No data to display  EKG: None  Radiology: No results found.   Procedures   Medications Ordered in the ED  lidocaine  (LIDODERM ) 5 % 1 patch (1 patch Transdermal Patch Applied 03/29/24 0936)  methocarbamol  (ROBAXIN ) tablet 500 mg (500 mg Oral Given 03/29/24 0936)  ketorolac  (TORADOL ) 15 MG/ML injection 15 mg (15 mg  Intramuscular Given 03/29/24 0936)                                    Medical Decision Making Risk Prescription drug management.   This patient presents to the ED for concern of left sided neck strain, this involves an extensive number of treatment options, and is a complaint that carries with it a high risk of complications and morbidity.  The differential diagnosis includes fracture, tendonitis, muscle strain, compartment syndrome   Co morbidities that complicate the patient evaluation  none   Additional history obtained:  No PCP listed in chart   Problem List / ED Course / Critical interventions / Medication management  Patient presents to ED concerned for left sided neck pain. Tylenol  provides relief. No other acute symptoms today. Physical exam with tenderness to left middle and upper trapezius. Rest of physical exam  reassuring. Patient afebrile with stable vitals. Provided patient with Toradol , Robaxin , and lidocaine  patch in ED.  Educated patient alternating Advil and Tylenol .  Provided patient with work note.  Recommended following up with PCP.  Patient verbalized understanding of plan. I have reviewed the patients home medicines and have made adjustments as needed The patient has been appropriately medically screened and/or stabilized in the ED. I have low suspicion for any other emergent medical condition which would require further screening, evaluation or treatment in the ED or require inpatient management. At time of discharge the patient is hemodynamically stable and in no acute distress. I have discussed work-up results and diagnosis with patient and answered all questions. Patient is agreeable with discharge plan. We discussed strict return precautions for returning to the emergency department and they verbalized understanding.     Social Determinants of Health:  Foreign language     Final diagnoses:  Strain of neck muscle, initial encounter    ED Discharge Orders          Ordered    methocarbamol  (ROBAXIN ) 500 MG tablet  2 times daily        03/29/24 0937                 Hoy Nidia FALCON, PA-C 03/29/24 9056    Freddi Hamilton, MD 03/30/24 1654

## 2024-03-29 NOTE — Discharge Instructions (Addendum)
 Please follow up with primary care. Seek emergency care if experiencing any new or worsening symptoms.   Alternating between 650 mg Tylenol  and 400 mg Advil: The best way to alternate taking Acetaminophen  (example Tylenol ) and Ibuprofen (example Advil/Motrin) is to take them 3 hours apart. For example, if you take ibuprofen at 6 am you can then take Tylenol  at 9 am. You can continue this regimen throughout the day, making sure you do not exceed the recommended maximum dose for each drug.   Por favor, consulte con su mdico de cabecera. Busque atencin de urgencias si experimenta sntomas nuevos o que empeoran.  Alternar entre Tylenol  de 650 mg y Advil de 400 mg: La mejor manera de alternar la toma de acetaminofn (por ejemplo, Tylenol ) e ibuprofeno (por ejemplo, Advil/Motrin) es tomarlos con 3 horas de diferencia. Por ejemplo, si toma ibuprofeno a las 6:00 a. m., puede tomar Tylenol  a las 9:00 a. m. Puede continuar con este rgimen a lo largo del da, asegurndose de no exceder la dosis mxima recomendada para Environmental health practitioner.
